# Patient Record
Sex: Male | Born: 1990 | Race: Black or African American | Hispanic: No | Marital: Single | Smoking: Never smoker
Health system: Southern US, Community
[De-identification: ages and names within clinical notes are randomized; demographics above are authoritative.]

## PROBLEM LIST (undated history)

## (undated) DIAGNOSIS — I10 Essential (primary) hypertension: Secondary | ICD-10-CM

## (undated) DIAGNOSIS — K5090 Crohn's disease, unspecified, without complications: Secondary | ICD-10-CM

---
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
## 2012-05-31 ENCOUNTER — Emergency Department (HOSPITAL_COMMUNITY)
Admission: EM | Admit: 2012-05-31 | Discharge: 2012-05-31 | Discharge status: Against Medical Advice | Payer: Medicaid Other | Attending: Emergency Medicine | Admitting: Emergency Medicine

## 2012-05-31 ENCOUNTER — Encounter (HOSPITAL_COMMUNITY): Payer: Self-pay | Admitting: Cardiology

## 2012-05-31 ENCOUNTER — Emergency Department (HOSPITAL_COMMUNITY): Payer: Medicaid Other

## 2012-05-31 DIAGNOSIS — R0789 Other chest pain: Secondary | ICD-10-CM | POA: Insufficient documentation

## 2012-05-31 DIAGNOSIS — I10 Essential (primary) hypertension: Secondary | ICD-10-CM | POA: Insufficient documentation

## 2012-05-31 DIAGNOSIS — K5090 Crohn's disease, unspecified, without complications: Secondary | ICD-10-CM | POA: Insufficient documentation

## 2012-05-31 DIAGNOSIS — R002 Palpitations: Secondary | ICD-10-CM | POA: Insufficient documentation

## 2012-05-31 DIAGNOSIS — I499 Cardiac arrhythmia, unspecified: Secondary | ICD-10-CM | POA: Insufficient documentation

## 2012-05-31 HISTORY — DX: Crohn's disease, unspecified, without complications: K50.90

## 2012-05-31 HISTORY — DX: Essential (primary) hypertension: I10

## 2012-05-31 LAB — TSH: TSH: 0.476 u[IU]/mL (ref 0.350–4.500)

## 2012-05-31 LAB — CK TOTAL AND CKMB (NOT AT ARMC)
CK, MB: 1.8 ng/mL (ref 0.3–4.0)
Relative Index: 1.4 (ref 0.0–2.5)
Total CK: 133 U/L (ref 7–232)

## 2012-05-31 LAB — RAPID URINE DRUG SCREEN, HOSP PERFORMED
Benzodiazepines: NOT DETECTED
Cocaine: NOT DETECTED

## 2012-05-31 LAB — POCT I-STAT, CHEM 8
Calcium, Ion: 1.25 mmol/L — ABNORMAL HIGH (ref 1.12–1.23)
Creatinine, Ser: 1.1 mg/dL (ref 0.50–1.35)
Glucose, Bld: 95 mg/dL (ref 70–99)
HCT: 40 % (ref 39.0–52.0)
Sodium: 143 mEq/L (ref 135–145)
TCO2: 29 mmol/L (ref 0–100)

## 2012-05-31 LAB — TROPONIN I
Troponin I: <0.3 ng/mL (ref <0.30)
Troponin I: <0.3 ng/mL (ref <0.30)

## 2012-05-31 NOTE — ED Provider Notes (Signed)
Medical screening examination/treatment/procedure(s) were performed by non-physician practitioner and as supervising physician I was immediately available for consultation/collaboration.   Richardean Canal, MD 05/31/12 [phone]

## 2012-05-31 NOTE — ED Provider Notes (Signed)
Patient has opted not to wait for completion of lab testing.  Currently resting comfortably with family at bedside.  No return of symptoms. Risks of leaving prior to completion of testing provided, and patient voices understanding.  Referral information provided for local cardiology ([place])--patient has also been followed by cardiology at [facility].  Jimmye Norman, NP 05/31/12 2313

## 2012-05-31 NOTE — ED Provider Notes (Signed)
History     CSN: [id number]  Arrival date & time 05/31/12  1539   First MD Initiated Contact with Patient 05/31/12 1542      Chief Complaint  Patient presents with  . Irregular Heart Beat    (Consider location/radiation/quality/duration/timing/severity/associated sxs/prior treatment) Patient is a 21 y.o. male presenting with palpitations. The history is provided by the patient.  Palpitations  This is a recurrent problem. The current episode started less than 1 hour ago. Episode frequency: Once. The problem has been resolved. On average, each episode lasts 10 minutes. Associated symptoms include chest pain (tightness). Pertinent negatives include no fever, no nausea, no vomiting, no cough and no shortness of breath. He has tried nothing for the symptoms. The treatment provided no relief. There are no known risk factors. His past medical history does not include anemia, heart disease, hyperthyroidism or valve disorder.    Past Medical History  Diagnosis Date  . Crohn disease   . Hypertension     History reviewed. No pertinent past surgical history.  History reviewed. No pertinent family history.  History  Substance Use Topics  . Smoking status: Not on file  . Smokeless tobacco: Not on file  . Alcohol Use:       Review of Systems  Constitutional: Negative for fever.  Respiratory: Negative for cough and shortness of breath.   Cardiovascular: Positive for chest pain (tightness) and palpitations.  Gastrointestinal: Negative for nausea and vomiting.  All other systems reviewed and are negative.    Allergies  Review of patient's allergies indicates no known allergies.  Home Medications   Current Outpatient Rx  Name  Route  Sig  Dispense  Refill  . ATENOLOL 50 MG PO TABS   Oral   Take 50 mg by mouth 2 (two) times daily.         Marland Kitchen REMICADE IV   Intravenous   Inject 1 Syringe into the vein every 8 (eight) weeks.          . TRETINOIN MICROSPHERE 0.04 % EX GEL   Topical   Apply 1 application topically at bedtime.           BP 153/90  Pulse 82  Temp 99 F (37.2 C) (Oral)  Resp 18  SpO2 100%  Physical Exam  Constitutional: He is oriented to person, place, and time. He appears well-developed and well-nourished. No distress.  HENT:  Head: Normocephalic and atraumatic.  Mouth/Throat: No oropharyngeal exudate.  Eyes: EOM are normal. Pupils are equal, round, and reactive to light.  Neck: Normal range of motion. Neck supple.  Cardiovascular: Normal rate and regular rhythm.  Exam reveals no friction rub.   No murmur heard. Pulmonary/Chest: Effort normal and breath sounds normal. No respiratory distress. He has no wheezes. He has no rales.  Abdominal: He exhibits no distension. There is no tenderness. There is no rebound.  Musculoskeletal: Normal range of motion. He exhibits no edema.  Neurological: He is alert and oriented to person, place, and time.  Skin: He is not diaphoretic.    ED Course  Procedures (including critical care time)  Labs Reviewed  POCT I-STAT, CHEM 8 - Abnormal; Notable for the following:    Calcium, Ion 1.25 (*)     All other components within normal limits  URINE RAPID DRUG SCREEN (HOSP PERFORMED)  TSH  TROPONIN I  CK TOTAL AND CKMB  TROPONIN I   Dg Chest 2 View  05/31/2012  *RADIOLOGY REPORT*  Clinical Data: Irregular heart beat.  Left chest pain.  CHEST - 2 VIEW  Comparison: 05/16/2009  Findings: Heart and mediastinal contours are within normal limits. No focal opacities or effusions.  No acute bony abnormality.  IMPRESSION: No active cardiopulmonary disease.   Original Report Authenticated By: Charlett Nose, M.D.      1. Irregular heart rate       MDM   And the patient is a 21 year old male with history of hypertension on atenolol who presents with palpitations. Had a 10 minute episode of palpitations with associated chest tightness after eating lunch today. History of palpitations in the past but not  had any previous diagnosis and he is aware of.  Patient here. EKG is normal. EKG is read as her carditis, however he has no pain, no diffuse ST elevation that I can see that is new. All the elevation looks like an early repolarization. With family history of early cardiac death (father at age 67), will cycle troponins. To CDU to await his troponins.   Elwin Mocha, MD 05/31/12 [phone]

## 2012-05-31 NOTE — ED Notes (Signed)
Pt to department via EMS from home- reports he was at home and started having palpations. Reports pressure and slight weakness. No distress on arrival. Bp-138/90 Hr-90.

## 2012-06-02 NOTE — ED Provider Notes (Signed)
Medical screening examination/treatment/procedure(s) were performed by non-physician practitioner and as supervising physician I was immediately available for consultation/collaboration.  Chad Reese is a 21 y.o. male hx of HTN here with palpitations. Palpitations for 10 min with chest tightness after lunch. EKG ? Pericarditis, but unchanged from prior and his CK was not elevated. His father died at age 8 so the decision is to do delta troponin. He was transferred to the CDU. In the CDU, he didn't want to wait for his second troponin so signed out AMA. The PA discussed the risk and benefits with him. He will f/u with [facility] cardiology.    Wandra Arthurs, MD 06/02/12 [phone]

## 2013-08-09 ENCOUNTER — Emergency Department (HOSPITAL_COMMUNITY)
Admission: EM | Admit: 2013-08-09 | Discharge: 2013-08-09 | Disposition: A | Discharge status: Home / Self Care | Payer: Medicaid Other | Attending: Emergency Medicine | Admitting: Emergency Medicine

## 2013-08-09 ENCOUNTER — Encounter (HOSPITAL_COMMUNITY): Payer: Self-pay | Admitting: Emergency Medicine

## 2013-08-09 DIAGNOSIS — I10 Essential (primary) hypertension: Secondary | ICD-10-CM | POA: Insufficient documentation

## 2013-08-09 DIAGNOSIS — J029 Acute pharyngitis, unspecified: Secondary | ICD-10-CM | POA: Insufficient documentation

## 2013-08-09 DIAGNOSIS — Z79899 Other long term (current) drug therapy: Secondary | ICD-10-CM | POA: Insufficient documentation

## 2013-08-09 DIAGNOSIS — K5090 Crohn's disease, unspecified, without complications: Secondary | ICD-10-CM | POA: Insufficient documentation

## 2013-08-09 MED ORDER — SODIUM CHLORIDE 0.9 % IV SOLN: 1000.0000 mL | INTRAVENOUS | Status: DC

## 2013-08-09 MED ORDER — CLINDAMYCIN PHOSPHATE 600 MG/50ML IV SOLN
600.0000 mg | Freq: Once | INTRAVENOUS | Status: AC
  Administered 2013-08-09: 600 mg via INTRAVENOUS
  Filled 2013-08-09: qty 50

## 2013-08-09 MED ORDER — CLINDAMYCIN HCL 150 MG PO CAPS: 300.0000 mg | ORAL_CAPSULE | Freq: Three times a day (TID) | ORAL | Status: DC

## 2013-08-09 MED ORDER — SODIUM CHLORIDE 0.9 % IV SOLN
1000.0000 mL | Freq: Once | INTRAVENOUS | Status: AC
  Administered 2013-08-09: 1000 mL via INTRAVENOUS

## 2013-08-09 NOTE — ED Provider Notes (Signed)
CSN: [id number]     Arrival date & time 08/09/13  1046 History   First MD Initiated Contact with Patient 08/09/13 1104     Chief Complaint  Patient presents with  . Sore Throat     (Consider location/radiation/quality/duration/timing/severity/associated sxs/prior Treatment) HPI Comments: Patient presents to the emergency department with chief complaint of sore throat. He states that the symptoms started approximately 10 days ago. He has had 2 negative strep test by his PCP. He states that he has been afebrile. He has been treated with amoxicillin and steroids with no improvement. States that the symptoms are persisting. He denies difficulty swallowing or breathing. Nothing makes his symptoms better or worse.  The history is provided by the patient. No language interpreter was used.    Past Medical History  Diagnosis Date  . Crohn disease   . Hypertension    History reviewed. No pertinent past surgical history. History reviewed. No pertinent family history. History  Substance Use Topics  . Smoking status: Never Smoker   . Smokeless tobacco: Never Used  . Alcohol Use: No    Review of Systems  Constitutional: Negative for fever.  HENT: Positive for sore throat. Negative for rhinorrhea.   Respiratory: Negative for cough, choking, shortness of breath and stridor.       Allergies  Review of patient's allergies indicates no known allergies.  Home Medications   Current Outpatient Rx  Name  Route  Sig  Dispense  Refill  . acetaminophen (TYLENOL) 500 MG tablet   Oral   Take 1,000 mg by mouth every 6 (six) hours as needed for mild pain or headache.         Marland Kitchen atenolol (TENORMIN) 50 MG tablet   Oral   Take 50 mg by mouth 2 (two) times daily.         . InFLIXimab (REMICADE IV)   Intravenous   Inject 1 Syringe into the vein every 8 (eight) weeks.          . naproxen sodium (ANAPROX) 220 MG tablet   Oral   Take 220 mg by mouth 2 (two) times daily as needed (pain).           BP 141/95  Pulse 105  Temp(Src) 97.9 F (36.6 C) (Oral)  Resp 18  SpO2 99% Physical Exam  Nursing note and vitals reviewed. Constitutional: He is oriented to person, place, and time. He appears well-developed and well-nourished.  HENT:  Head: Normocephalic and atraumatic.  Oropharynx is erythematous, with the left side being greater than the right, there is some deviation, some evidence of left-sided peritonsillar abscess  Eyes: Conjunctivae and EOM are normal.  Neck: Normal range of motion.  Cardiovascular: Normal rate.   Pulmonary/Chest: Effort normal.  Abdominal: He exhibits no distension.  Musculoskeletal: Normal range of motion.  Neurological: He is alert and oriented to person, place, and time.  Skin: Skin is dry.  Psychiatric: He has a normal mood and affect. His behavior is normal. Judgment and thought content normal.    ED Course  Procedures (including critical care time) Labs Review Labs Reviewed - No data to display Imaging Review No results found.  EKG Interpretation   None       MDM   Final diagnoses:  Sore throat    Patient with sore throat.  He has been treated with amoxicillin and steroids with no relief.    Patient discussed with Dr. Loretha Stapler, who recommends consulting ENT.  I discussed the patient with Dr.  Shoemaker from ENT, who recommends giving the patient a dose of IV clindamycin and 2 liters of fluid.  Recommends discharging on Clindamycin, and seeing the patient for follow-up in 2 days.  Strict and clear return precautions have been given.  Patient will return to the ED or call 911 if symptoms worsen, otherwise will follow-up with Dr. Annalee GentaShoemaker in his office.  Discussed with Dr. Loretha StaplerWofford, who agrees with the plan.  Patient is stable and ready for discharge.  3:53 PM Re-examined several times.  No stridor, drooling, or airway compromise.  Roxy Horsemanobert Sister Carbone, PA-C 08/09/13 1553  Roxy Horsemanobert Elina Streng, PA-C 08/09/13 [phone]

## 2013-08-09 NOTE — Progress Notes (Signed)
   CARE MANAGEMENT ED NOTE 08/09/2013  Patient:  Chad Reese, Chad Reese   Account Number:  [id number]  Date Initiated:  08/09/2013  Documentation initiated by:  Radford Pax  Subjective/Objective Assessment:   Patient presents to Ed with sore throat for ten days and fever.     Subjective/Objective Assessment Detail:     Action/Plan:   Action/Plan Detail:   Anticipated DC Date:       Status Recommendation to Physician:   Result of Recommendation:    Other ED Services  Consult Working Plan    DC Planning Services  Other  PCP issues    Choice offered to / List presented to:            Status of service:  Completed, signed off  ED Comments:   ED Comments Detail:  Patient confirms his pcp is located at [facility] internal Medicine at [facility].  System updated.

## 2013-08-09 NOTE — Discharge Instructions (Signed)
Peritonsillar Abscess A peritonsillar abscess is a collection of pus located in the back of the throat behind the tonsils. It usually occurs when a streptococcal infection of the throat or tonsils spreads into the space around the tonsils. They are almost always caused by the streptococcal germ (bacteria). The treatment of a peritonsillar abscess is most often drainage accomplished by putting a needle into the abscess or cutting (incising) and draining the abscess. This is most often followed with a course of antibiotics. HOME CARE INSTRUCTIONS  If your abscess was drained by your caregiver today, rinse your throat (gargle) with warm salt water four times per day or as needed for comfort. Do not swallow this mixture. Mix 1 teaspoon of salt in 8 ounces of warm water for gargling.  Rest in bed as needed. Resume activities as able.  Apply cold to your neck for pain relief. Fill a plastic bag with ice and wrap it in a towel. Hold the ice on your neck for 20 minutes 4 times per day.  Eat a soft or liquid diet as tolerated while your throat remains sore. Popsicles and ice cream may be good early choices. Drinking plenty of cold fluids will probably be soothing and help take swelling down in between the warm gargles.  Only take over-the-counter or prescription medicines for pain, discomfort, or fever as directed by your caregiver. Do not use aspirin unless directed by your physician. Aspirin slows down the clotting process. It can also cause bleeding from the drainage area if this was needled or incised today.  If antibiotics were prescribed, take them as directed for the full course of the prescription. Even if you feel you are well, you need to take them. SEEK MEDICAL CARE IF:   You have increased pain, swelling, redness, or drainage in your throat.  You develop signs of infection such as dizziness, headache, lethargy, or generalized feelings of illness.  You have difficulty breathing, swallowing or  eating.  You show signs of becoming dehydrated (lightheadedness when standing, decreased urine output, a fast heart rate, or dry mouth and mucous membranes). SEEK IMMEDIATE MEDICAL CARE IF:   You have a fever.  You are coughing up or vomiting blood.  You develop more severe throat pain uncontrolled with medicines or you start to drool.  You develop difficulty breathing, talking, or find it easier to breathe while leaning forward. Document Released: 06/01/2005 Document Revised: 08/24/2011 Document Reviewed: 01/13/2008 [facility] Partnership[facility] Patient Information 2014 [place], [place]. Sore Throat A sore throat is pain, burning, irritation, or scratchiness of the throat. There is often pain or tenderness when swallowing or talking. A sore throat may be accompanied by other symptoms, such as coughing, sneezing, fever, and swollen neck glands. A sore throat is often the first sign of another sickness, such as a cold, flu, strep throat, or mononucleosis (commonly known as mono). Most sore throats go away without medical treatment. CAUSES  The most common causes of a sore throat include:  A viral infection, such as a cold, flu, or mono.  A bacterial infection, such as strep throat, tonsillitis, or whooping cough.  Seasonal allergies.  Dryness in the air.  Irritants, such as smoke or pollution.  Gastroesophageal reflux disease (GERD). HOME CARE INSTRUCTIONS   Only take over-the-counter medicines as directed by your caregiver.  Drink enough fluids to keep your urine clear or pale yellow.  Rest as needed.  Try using throat sprays, lozenges, or sucking on hard candy to ease any pain (if older than 4  years or as directed).  Sip warm liquids, such as broth, herbal tea, or warm water with honey to relieve pain temporarily. You may also eat or drink cold or frozen liquids such as frozen ice pops.  Gargle with salt water (mix 1 tsp salt with 8 oz of water).  Do not smoke and avoid secondhand  smoke.  Put a cool-mist humidifier in your bedroom at night to moisten the air. You can also turn on a hot shower and sit in the bathroom with the door closed for 5 10 minutes. SEEK IMMEDIATE MEDICAL CARE IF:  You have difficulty breathing.  You are unable to swallow fluids, soft foods, or your saliva.  You have increased swelling in the throat.  Your sore throat does not get better in 7 days.  You have nausea and vomiting.  You have a fever or persistent symptoms for more than 2 3 days.  You have a fever and your symptoms suddenly get worse. MAKE SURE YOU:   Understand these instructions.  Will watch your condition.  Will get help right away if you are not doing well or get worse. Document Released: 07/09/2004 Document Revised: 05/18/2012 Document Reviewed: 02/07/2012 [facility] Patient Information 2014 [place], [place].

## 2013-08-09 NOTE — Progress Notes (Signed)
[facility] provided pt with a list of primary care resources. Patient stated that she was pending insurance on 3/1.

## 2013-08-09 NOTE — ED Provider Notes (Signed)
Medical screening examination/treatment/procedure(s) were conducted as a shared visit with non-physician practitioner(s) and myself.  I personally evaluated the patient during the encounter.  EKG Interpretation   None       23 yo male with sore throat for 10 days.  Fevers initially, not now.  On exam, well appearing, no stridor, no distress, normal respiratory effort, mild to moderate swelling of left posterior pharynx without significant mass effect.  Plan to discuss with ENT.    Clinical Impression: 1. Sore throat       Candyce Churn III, MD 08/09/13 [phone]

## 2013-08-09 NOTE — ED Notes (Signed)
Per pt, sore throat starting Feb 14th  Pt states he has had 2 previous test for strep since event started.  Fever in beginning but has had no fever since Tuesday.  Pt also states he has been treated with steroids by Personal physician.

## 2013-08-09 NOTE — ED Provider Notes (Signed)
Medical screening examination/treatment/procedure(s) were conducted as a shared visit with non-physician practitioner(s) and myself.  I personally evaluated the patient during the encounter.  EKG Interpretation   None         Candyce Churn III, MD 08/09/13 [phone]

## 2014-05-21 ENCOUNTER — Other Ambulatory Visit: Payer: Self-pay

## 2014-05-21 ENCOUNTER — Encounter (HOSPITAL_COMMUNITY): Payer: Self-pay | Admitting: Family Medicine

## 2014-05-21 ENCOUNTER — Emergency Department (HOSPITAL_COMMUNITY): Payer: Medicaid Other

## 2014-05-21 ENCOUNTER — Emergency Department (HOSPITAL_COMMUNITY)
Admission: EM | Admit: 2014-05-21 | Discharge: 2014-05-21 | Disposition: A | Discharge status: Home / Self Care | Payer: Medicaid Other | Attending: Emergency Medicine | Admitting: Emergency Medicine

## 2014-05-21 DIAGNOSIS — K5090 Crohn's disease, unspecified, without complications: Secondary | ICD-10-CM | POA: Insufficient documentation

## 2014-05-21 DIAGNOSIS — Z79899 Other long term (current) drug therapy: Secondary | ICD-10-CM | POA: Insufficient documentation

## 2014-05-21 DIAGNOSIS — I10 Essential (primary) hypertension: Secondary | ICD-10-CM | POA: Insufficient documentation

## 2014-05-21 DIAGNOSIS — R079 Chest pain, unspecified: Secondary | ICD-10-CM

## 2014-05-21 DIAGNOSIS — Z792 Long term (current) use of antibiotics: Secondary | ICD-10-CM | POA: Insufficient documentation

## 2014-05-21 DIAGNOSIS — R0789 Other chest pain: Secondary | ICD-10-CM | POA: Insufficient documentation

## 2014-05-21 DIAGNOSIS — Z791 Long term (current) use of non-steroidal anti-inflammatories (NSAID): Secondary | ICD-10-CM | POA: Insufficient documentation

## 2014-05-21 LAB — CBC WITH DIFFERENTIAL/PLATELET
BASOS ABS: 0 10*3/uL (ref 0.0–0.1)
BASOS PCT: 1 % (ref 0–1)
Eosinophils Absolute: 0.1 10*3/uL (ref 0.0–0.7)
Eosinophils Relative: 2 % (ref 0–5)
HEMATOCRIT: 40.1 % (ref 39.0–52.0)
Hemoglobin: 13.7 g/dL (ref 13.0–17.0)
Lymphocytes Relative: 32 % (ref 12–46)
Lymphs Abs: 1.4 10*3/uL (ref 0.7–4.0)
MCH: 29.3 pg (ref 26.0–34.0)
MCHC: 34.2 g/dL (ref 30.0–36.0)
MCV: 85.7 fL (ref 78.0–100.0)
Monocytes Absolute: 0.4 10*3/uL (ref 0.1–1.0)
Monocytes Relative: 10 % (ref 3–12)
NEUTROS ABS: 2.3 10*3/uL (ref 1.7–7.7)
Neutrophils Relative %: 55 % (ref 43–77)
Platelets: 223 10*3/uL (ref 150–400)
RBC: 4.68 MIL/uL (ref 4.22–5.81)
RDW: 12 % (ref 11.5–15.5)
WBC: 4.2 10*3/uL (ref 4.0–10.5)

## 2014-05-21 LAB — I-STAT CHEM 8, ED
BUN: 9 mg/dL (ref 6–23)
CALCIUM ION: 1.19 mmol/L (ref 1.12–1.23)
CHLORIDE: 105 meq/L (ref 96–112)
Creatinine, Ser: 0.9 mg/dL (ref 0.50–1.35)
Glucose, Bld: 85 mg/dL (ref 70–99)
HEMATOCRIT: 42 % (ref 39.0–52.0)
HEMOGLOBIN: 14.3 g/dL (ref 13.0–17.0)
Potassium: 4 mEq/L (ref 3.7–5.3)
Sodium: 140 mEq/L (ref 137–147)
TCO2: 25 mmol/L (ref 0–100)

## 2014-05-21 LAB — I-STAT TROPONIN, ED
TROPONIN I, POC: 0 ng/mL (ref 0.00–0.08)
Troponin i, poc: 0.06 ng/mL (ref 0.00–0.08)

## 2014-05-21 NOTE — ED Provider Notes (Signed)
CSN: [id number]     Arrival date & time 05/21/14  1037 History   First MD Initiated Contact with Patient 05/21/14 1044     Chief Complaint  Patient presents with  . Chest Pain     (Consider location/radiation/quality/duration/timing/severity/associated sxs/prior Treatment) Patient is a 23 y.o. male presenting with chest pain. The history is provided by the patient.  Chest Pain Pain location:  L chest Associated symptoms: nausea   Associated symptoms: no abdominal pain, no back pain, no headache, no numbness, no shortness of breath, not vomiting and no weakness    patient presents with sharp left-sided chest pain. Began while eating. Last around 40 minutes and improved with nitroglycerin. Pain-free now. Mild nausea before the event. No vomiting. No shortness of breath. No pleuritic aspect. Patient has Crohn's disease and hypertension. He has a cardiologist at [facility]. He states his father had a heart attack at 45 years old, and his father's brother also had an MI at a young age. Patient denies substance abuse. No fevers. No trauma. No blood in the stool.  Past Medical History  Diagnosis Date  . Crohn disease   . Hypertension    History reviewed. No pertinent past surgical history. No family history on file. History  Substance Use Topics  . Smoking status: Never Smoker   . Smokeless tobacco: Never Used  . Alcohol Use: No    Review of Systems  Constitutional: Negative for activity change and appetite change.  Eyes: Negative for pain.  Respiratory: Negative for chest tightness and shortness of breath.   Cardiovascular: Positive for chest pain. Negative for leg swelling.  Gastrointestinal: Positive for nausea. Negative for vomiting, abdominal pain and diarrhea.  Genitourinary: Negative for flank pain.  Musculoskeletal: Negative for back pain and neck stiffness.  Skin: Negative for rash.  Neurological: Negative for weakness, numbness and headaches.  Psychiatric/Behavioral:  Negative for behavioral problems.      Allergies  Review of patient's allergies indicates no known allergies.  Home Medications   Prior to Admission medications   Medication Sig Start Date End Date Taking? Authorizing Provider  acetaminophen (TYLENOL) 500 MG tablet Take 1,000 mg by mouth every 6 (six) hours as needed for mild pain or headache.   Yes Historical Provider, MD  atenolol (TENORMIN) 50 MG tablet Take 50 mg by mouth 2 (two) times daily.   Yes Historical Provider, MD  InFLIXimab (REMICADE IV) Inject into the vein every 8 (eight) weeks.    Yes Historical Provider, MD  clindamycin (CLEOCIN) 150 MG capsule Take 2 capsules (300 mg total) by mouth 3 (three) times daily. May dispense as 17m capsules 08/09/13   RMontine Circle PA-C  naproxen sodium (ANAPROX) 220 MG tablet Take 220 mg by mouth 2 (two) times daily as needed (pain).    Historical Provider, MD   BP 122/77 mmHg  Pulse 67  Temp(Src) 98.3 F (36.8 C) (Oral)  Resp 15  Ht 5' 6"  (1.676 m)  Wt 158 lb (71.668 kg)  BMI 25.51 kg/m2  SpO2 99% Physical Exam  Constitutional: He appears well-developed and well-nourished.  HENT:  Head: Normocephalic and atraumatic.  Neck: Neck supple. No JVD present.  Cardiovascular: Normal rate, regular rhythm and normal heart sounds.  Exam reveals no gallop and no friction rub.   No murmur heard. Pulmonary/Chest: Effort normal and breath sounds normal.  Abdominal: Soft. There is no tenderness.  Musculoskeletal: Normal range of motion. He exhibits no edema.  Neurological: He is alert. No cranial nerve deficit.  Skin: Skin is warm and dry. No rash noted.  Psychiatric: He has a normal mood and affect.  Nursing note and vitals reviewed.   ED Course  Procedures (including critical care time) Labs Review Labs Reviewed  CBC WITH DIFFERENTIAL  I-STAT CHEM 8, ED  I-STAT TROPOININ, ED  Randolm Idol, ED    Imaging Review Dg Chest 2 View  05/21/2014   CLINICAL DATA:  Sudden onset  left chest pain radiating to the left arm today.  EXAM: CHEST  2 VIEW  COMPARISON:  May 31, 2012  FINDINGS: The heart size and mediastinal contours are within normal limits. There is no focal infiltrate, pulmonary edema, or pleural effusion. The visualized skeletal structures are unremarkable.  IMPRESSION: No active cardiopulmonary disease.   Electronically Signed   By: Abelardo Diesel M.D.   On: 05/21/2014 13:54     EKG Interpretation None      Date: 05/21/2014  Rate: 77  Rhythm: normal sinus rhythm  QRS Axis: normal  Intervals: normal  ST/T Wave abnormalities: early repolarization  Conduction Disutrbances:none  Narrative Interpretation:   Old EKG Reviewed: none available   MDM   Final diagnoses:  Chest pain    Patient with chest pain. EKG reassuring. Troponin negative 2. He is low risk except for family history. Will discharge to follow with his cardiologist. Doubt pulmonary embolism or aortic dissection.    Jasper Riling. Alvino Chapel, MD 05/22/14 1251

## 2014-05-21 NOTE — Discharge Instructions (Signed)

## 2014-05-21 NOTE — ED Notes (Addendum)
Pt presents from work via [employer] with c/o sudden onset left sided chest pain that radiated to left arm. PT was given 1SL nitro and 324mg  ASA PTA with relief of all pain. Pt is A&Ox4 and in NAD. PT reports father had MI at age 21.

## 2014-05-21 NOTE — ED Notes (Signed)
To x-ray

## 2015-02-15 ENCOUNTER — Encounter (HOSPITAL_COMMUNITY): Payer: Self-pay | Admitting: Emergency Medicine

## 2015-02-15 ENCOUNTER — Emergency Department (HOSPITAL_COMMUNITY)
Admission: EM | Admit: 2015-02-15 | Discharge: 2015-02-15 | Disposition: A | Discharge status: Home / Self Care | Payer: BLUE CROSS/BLUE SHIELD | Attending: Emergency Medicine | Admitting: Emergency Medicine

## 2015-02-15 ENCOUNTER — Emergency Department (HOSPITAL_COMMUNITY): Payer: BLUE CROSS/BLUE SHIELD

## 2015-02-15 DIAGNOSIS — T1490XA Injury, unspecified, initial encounter: Secondary | ICD-10-CM

## 2015-02-15 DIAGNOSIS — S139XXA Sprain of joints and ligaments of unspecified parts of neck, initial encounter: Secondary | ICD-10-CM | POA: Diagnosis not present

## 2015-02-15 DIAGNOSIS — Z8719 Personal history of other diseases of the digestive system: Secondary | ICD-10-CM | POA: Insufficient documentation

## 2015-02-15 DIAGNOSIS — S199XXA Unspecified injury of neck, initial encounter: Secondary | ICD-10-CM | POA: Diagnosis present

## 2015-02-15 DIAGNOSIS — Z79899 Other long term (current) drug therapy: Secondary | ICD-10-CM | POA: Insufficient documentation

## 2015-02-15 DIAGNOSIS — Y998 Other external cause status: Secondary | ICD-10-CM | POA: Diagnosis not present

## 2015-02-15 DIAGNOSIS — Y92410 Unspecified street and highway as the place of occurrence of the external cause: Secondary | ICD-10-CM | POA: Diagnosis not present

## 2015-02-15 DIAGNOSIS — S299XXA Unspecified injury of thorax, initial encounter: Secondary | ICD-10-CM | POA: Insufficient documentation

## 2015-02-15 DIAGNOSIS — Y9389 Activity, other specified: Secondary | ICD-10-CM | POA: Diagnosis not present

## 2015-02-15 DIAGNOSIS — I10 Essential (primary) hypertension: Secondary | ICD-10-CM | POA: Diagnosis not present

## 2015-02-15 MED ORDER — ACETAMINOPHEN 325 MG PO TABS
650.0000 mg | ORAL_TABLET | Freq: Once | ORAL | Status: AC
  Administered 2015-02-15: 650 mg via ORAL
  Filled 2015-02-15: qty 2

## 2015-02-15 MED ORDER — METHOCARBAMOL 750 MG PO TABS: 750.0000 mg | ORAL_TABLET | Freq: Four times a day (QID) | ORAL | Status: DC
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
## 2015-02-15 NOTE — ED Notes (Signed)
Patient transported to CT 

## 2015-02-15 NOTE — Discharge Instructions (Signed)
Chest Wall Pain Chest wall pain is pain in or around the bones and muscles of your chest. It may take up to 6 weeks to get better. It may take longer if you must stay physically active in your work and activities.  CAUSES  Chest wall pain may happen on its own. However, it may be caused by:  A viral illness like the flu.  Injury.  Coughing.  Exercise.  Arthritis.  Fibromyalgia.  Shingles. HOME CARE INSTRUCTIONS   Avoid overtiring physical activity. Try not to strain or perform activities that cause pain. This includes any activities using your chest or your abdominal and side muscles, especially if heavy weights are used.  Put ice on the sore area.  Put ice in a plastic bag.  Place a towel between your skin and the bag.  Leave the ice on for 15-20 minutes per hour while awake for the first 2 days.  Only take over-the-counter or prescription medicines for pain, discomfort, or fever as directed by your caregiver. SEEK IMMEDIATE MEDICAL CARE IF:   Your pain increases, or you are very uncomfortable.  You have a fever.  Your chest pain becomes worse.  You have new, unexplained symptoms.  You have nausea or vomiting.  You feel sweaty or lightheaded.  You have a cough with phlegm (sputum), or you cough up blood. MAKE SURE YOU:   Understand these instructions.  Will watch your condition.  Will get help right away if you are not doing well or get worse. Document Released: 06/01/2005 Document Revised: 08/24/2011 Document Reviewed: 01/26/2011 [facility] Patient Information 2015 [place], [place]. This information is not intended to replace advice given to you by your health care provider. Make sure you discuss any questions you have with your health care provider. Cervical Sprain A cervical sprain is an injury in the neck in which the strong, fibrous tissues (ligaments) that connect your neck bones stretch or tear. Cervical sprains can range from mild to severe. Severe  cervical sprains can cause the neck vertebrae to be unstable. This can lead to damage of the spinal cord and can result in serious nervous system problems. The amount of time it takes for a cervical sprain to get better depends on the cause and extent of the injury. Most cervical sprains heal in 1 to 3 weeks. CAUSES  Severe cervical sprains may be caused by:   Contact sport injuries (such as from football, rugby, wrestling, hockey, auto racing, gymnastics, diving, martial arts, or boxing).   Motor vehicle collisions.   Whiplash injuries. This is an injury from a sudden forward and backward whipping movement of the head and neck.  Falls.  Mild cervical sprains may be caused by:   Being in an awkward position, such as while cradling a telephone between your ear and shoulder.   Sitting in a chair that does not offer proper support.   Working at a poorly Landscape architect station.   Looking up or down for long periods of time.  SYMPTOMS   Pain, soreness, stiffness, or a burning sensation in the front, back, or sides of the neck. This discomfort may develop immediately after the injury or slowly, 24 hours or more after the injury.   Pain or tenderness directly in the middle of the back of the neck.   Shoulder or upper back pain.   Limited ability to move the neck.   Headache.   Dizziness.   Weakness, numbness, or tingling in the hands or arms.   Muscle  spasms.   Difficulty swallowing or chewing.   Tenderness and swelling of the neck.  DIAGNOSIS  Most of the time your health care provider can diagnose a cervical sprain by taking your history and doing a physical exam. Your health care provider will ask about previous neck injuries and any known neck problems, such as arthritis in the neck. X-rays may be taken to find out if there are any other problems, such as with the bones of the neck. Other tests, such as a CT scan or MRI, may also be needed.  TREATMENT    Treatment depends on the severity of the cervical sprain. Mild sprains can be treated with rest, keeping the neck in place (immobilization), and pain medicines. Severe cervical sprains are immediately immobilized. Further treatment is done to help with pain, muscle spasms, and other symptoms and may include:  Medicines, such as pain relievers, numbing medicines, or muscle relaxants.   Physical therapy. This may involve stretching exercises, strengthening exercises, and posture training. Exercises and improved posture can help stabilize the neck, strengthen muscles, and help stop symptoms from returning.  HOME CARE INSTRUCTIONS   Put ice on the injured area.   Put ice in a plastic bag.   Place a towel between your skin and the bag.   Leave the ice on for 15-20 minutes, 3-4 times a day.   If your injury was severe, you may have been given a cervical collar to wear. A cervical collar is a two-piece collar designed to keep your neck from moving while it heals.  Do not remove the collar unless instructed by your health care provider.  If you have long hair, keep it outside of the collar.  Ask your health care provider before making any adjustments to your collar. Minor adjustments may be required over time to improve comfort and reduce pressure on your chin or on the back of your head.  Ifyou are allowed to remove the collar for cleaning or bathing, follow your health care provider's instructions on how to do so safely.  Keep your collar clean by wiping it with mild soap and water and drying it completely. If the collar you have been given includes removable pads, remove them every 1-2 days and hand wash them with soap and water. Allow them to air dry. They should be completely dry before you wear them in the collar.  If you are allowed to remove the collar for cleaning and bathing, wash and dry the skin of your neck. Check your skin for irritation or sores. If you see any, tell your  health care provider.  Do not drive while wearing the collar.   Only take over-the-counter or prescription medicines for pain, discomfort, or fever as directed by your health care provider.   Keep all follow-up appointments as directed by your health care provider.   Keep all physical therapy appointments as directed by your health care provider.   Make any needed adjustments to your workstation to promote good posture.   Avoid positions and activities that make your symptoms worse.   Warm up and stretch before being active to help prevent problems.  SEEK MEDICAL CARE IF:   Your pain is not controlled with medicine.   You are unable to decrease your pain medicine over time as planned.   Your activity level is not improving as expected.  SEEK IMMEDIATE MEDICAL CARE IF:   You develop any bleeding.  You develop stomach upset.  You have signs of an  allergic reaction to your medicine.   Your symptoms get worse.   You develop new, unexplained symptoms.   You have numbness, tingling, weakness, or paralysis in any part of your body.  MAKE SURE YOU:   Understand these instructions.  Will watch your condition.  Will get help right away if you are not doing well or get worse. Document Released: 03/29/2007 Document Revised: 06/06/2013 Document Reviewed: 12/07/2012 [facility] Patient Information 2015 [place], [place]. This information is not intended to replace advice given to you by your health care provider. Make sure you discuss any questions you have with your health care provider. Motor Vehicle Collision It is common to have multiple bruises and sore muscles after a motor vehicle collision (MVC). These tend to feel worse for the first 24 hours. You may have the most stiffness and soreness over the first several hours. You may also feel worse when you wake up the first morning after your collision. After this point, you will usually begin to improve with each day. The  speed of improvement often depends on the severity of the collision, the number of injuries, and the location and nature of these injuries. HOME CARE INSTRUCTIONS  Put ice on the injured area.  Put ice in a plastic bag.  Place a towel between your skin and the bag.  Leave the ice on for 15-20 minutes, 3-4 times a day, or as directed by your health care provider.  Drink enough fluids to keep your urine clear or pale yellow. Do not drink alcohol.  Take a warm shower or bath once or twice a day. This will increase blood flow to sore muscles.  You may return to activities as directed by your caregiver. Be careful when lifting, as this may aggravate neck or back pain.  Only take over-the-counter or prescription medicines for pain, discomfort, or fever as directed by your caregiver. Do not use aspirin. This may increase bruising and bleeding. SEEK IMMEDIATE MEDICAL CARE IF:  You have numbness, tingling, or weakness in the arms or legs.  You develop severe headaches not relieved with medicine.  You have severe neck pain, especially tenderness in the middle of the back of your neck.  You have changes in bowel or bladder control.  There is increasing pain in any area of the body.  You have shortness of breath, light-headedness, dizziness, or fainting.  You have chest pain.  You feel sick to your stomach (nauseous), throw up (vomit), or sweat.  You have increasing abdominal discomfort.  There is blood in your urine, stool, or vomit.  You have pain in your shoulder (shoulder strap areas).  You feel your symptoms are getting worse. MAKE SURE YOU:  Understand these instructions.  Will watch your condition.  Will get help right away if you are not doing well or get worse. Document Released: 06/01/2005 Document Revised: 10/16/2013 Document Reviewed: 10/29/2010 [facility] Patient Information 2015 [place], [place]. This information is not intended to replace advice given to you by your  health care provider. Make sure you discuss any questions you have with your health care provider.
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
## 2015-02-15 NOTE — ED Notes (Signed)
D/C instructions and medications reviewed with pt.  Pt verbalized understanding.  Pt home with significant other.
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
## 2015-02-17 ENCOUNTER — Emergency Department (HOSPITAL_COMMUNITY)
Admission: EM | Admit: 2015-02-17 | Discharge: 2015-02-17 | Disposition: A | Discharge status: Home / Self Care | Payer: PRIVATE HEALTH INSURANCE | Attending: Emergency Medicine | Admitting: Emergency Medicine

## 2015-02-17 ENCOUNTER — Encounter (HOSPITAL_COMMUNITY): Payer: Self-pay | Admitting: Emergency Medicine

## 2015-02-17 DIAGNOSIS — M545 Low back pain: Secondary | ICD-10-CM | POA: Diagnosis present

## 2015-02-17 DIAGNOSIS — Z87828 Personal history of other (healed) physical injury and trauma: Secondary | ICD-10-CM | POA: Diagnosis not present

## 2015-02-17 DIAGNOSIS — M25511 Pain in right shoulder: Secondary | ICD-10-CM | POA: Insufficient documentation

## 2015-02-17 DIAGNOSIS — M62830 Muscle spasm of back: Secondary | ICD-10-CM | POA: Insufficient documentation

## 2015-02-17 DIAGNOSIS — Z79899 Other long term (current) drug therapy: Secondary | ICD-10-CM | POA: Insufficient documentation

## 2015-02-17 DIAGNOSIS — Z8719 Personal history of other diseases of the digestive system: Secondary | ICD-10-CM | POA: Insufficient documentation

## 2015-02-17 DIAGNOSIS — I10 Essential (primary) hypertension: Secondary | ICD-10-CM | POA: Insufficient documentation

## 2015-02-17 DIAGNOSIS — M62838 Other muscle spasm: Secondary | ICD-10-CM

## 2015-02-17 MED ORDER — KETOROLAC TROMETHAMINE 10 MG PO TABS: 10.0000 mg | ORAL_TABLET | Freq: Four times a day (QID) | ORAL | Status: DC | PRN
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
## 2015-02-17 NOTE — Discharge Instructions (Signed)
Please continue taking Robaxin and start taking Toradol as prescribed for pain. Please return to the Emergency Department if symptoms worsen or new onset of fever, numbness, tingling, loss of bowel or bladder, weakness.
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
## 2015-03-15 ENCOUNTER — Emergency Department (HOSPITAL_COMMUNITY)
Admission: EM | Admit: 2015-03-15 | Discharge: 2015-03-15 | Disposition: A | Discharge status: Home / Self Care | Payer: BLUE CROSS/BLUE SHIELD | Attending: Emergency Medicine | Admitting: Emergency Medicine

## 2015-03-15 ENCOUNTER — Emergency Department (HOSPITAL_COMMUNITY): Payer: BLUE CROSS/BLUE SHIELD

## 2015-03-15 ENCOUNTER — Encounter (HOSPITAL_COMMUNITY): Payer: Self-pay | Admitting: Emergency Medicine

## 2015-03-15 DIAGNOSIS — K5090 Crohn's disease, unspecified, without complications: Secondary | ICD-10-CM | POA: Insufficient documentation

## 2015-03-15 DIAGNOSIS — Z79899 Other long term (current) drug therapy: Secondary | ICD-10-CM | POA: Insufficient documentation

## 2015-03-15 DIAGNOSIS — I10 Essential (primary) hypertension: Secondary | ICD-10-CM | POA: Insufficient documentation

## 2015-03-15 DIAGNOSIS — R0602 Shortness of breath: Secondary | ICD-10-CM | POA: Insufficient documentation

## 2015-03-15 DIAGNOSIS — R110 Nausea: Secondary | ICD-10-CM | POA: Diagnosis not present

## 2015-03-15 DIAGNOSIS — R109 Unspecified abdominal pain: Secondary | ICD-10-CM | POA: Insufficient documentation

## 2015-03-15 DIAGNOSIS — R079 Chest pain, unspecified: Secondary | ICD-10-CM

## 2015-03-15 LAB — COMPREHENSIVE METABOLIC PANEL
ALT: 14 U/L — AB (ref 17–63)
AST: 19 U/L (ref 15–41)
Albumin: 3.3 g/dL — ABNORMAL LOW (ref 3.5–5.0)
Alkaline Phosphatase: 50 U/L (ref 38–126)
Anion gap: 6 (ref 5–15)
BUN: 10 mg/dL (ref 6–20)
CALCIUM: 9.3 mg/dL (ref 8.9–10.3)
CO2: 30 mmol/L (ref 22–32)
CREATININE: 1.06 mg/dL (ref 0.61–1.24)
Chloride: 103 mmol/L (ref 101–111)
Glucose, Bld: 86 mg/dL (ref 65–99)
Potassium: 4.2 mmol/L (ref 3.5–5.1)
Sodium: 139 mmol/L (ref 135–145)
TOTAL PROTEIN: 7.5 g/dL (ref 6.5–8.1)
Total Bilirubin: 1.1 mg/dL (ref 0.3–1.2)

## 2015-03-15 LAB — I-STAT TROPONIN, ED
TROPONIN I, POC: 0 ng/mL (ref 0.00–0.08)
Troponin i, poc: 0 ng/mL (ref 0.00–0.08)

## 2015-03-15 LAB — CBC WITH DIFFERENTIAL/PLATELET
BASOS ABS: 0 10*3/uL (ref 0.0–0.1)
BASOS PCT: 0 %
EOS ABS: 0.2 10*3/uL (ref 0.0–0.7)
Eosinophils Relative: 3 %
HCT: 36.9 % — ABNORMAL LOW (ref 39.0–52.0)
HEMOGLOBIN: 11.7 g/dL — AB (ref 13.0–17.0)
Lymphocytes Relative: 18 %
Lymphs Abs: 1.1 10*3/uL (ref 0.7–4.0)
MCH: 26.7 pg (ref 26.0–34.0)
MCHC: 31.7 g/dL (ref 30.0–36.0)
MCV: 84.2 fL (ref 78.0–100.0)
Monocytes Absolute: 0.9 10*3/uL (ref 0.1–1.0)
Monocytes Relative: 15 %
Neutro Abs: 3.8 10*3/uL (ref 1.7–7.7)
Neutrophils Relative %: 64 %
Platelets: 391 10*3/uL (ref 150–400)
RBC: 4.38 MIL/uL (ref 4.22–5.81)
RDW: 12.2 % (ref 11.5–15.5)
WBC: 6 10*3/uL (ref 4.0–10.5)

## 2015-03-15 MED ORDER — METHOCARBAMOL 750 MG PO TABS: 750.0000 mg | ORAL_TABLET | Freq: Four times a day (QID) | ORAL | Status: DC

## 2015-03-15 MED ORDER — KETOROLAC TROMETHAMINE 30 MG/ML IJ SOLN
30.0000 mg | Freq: Once | INTRAMUSCULAR | Status: AC
  Administered 2015-03-15: 30 mg via INTRAVENOUS
  Filled 2015-03-15: qty 1

## 2015-03-15 NOTE — Discharge Instructions (Signed)

## 2015-03-15 NOTE — ED Provider Notes (Signed)
CSN: [id number]     Arrival date & time 03/15/15  4098 History   First MD Initiated Contact with Patient 03/15/15 [phone]     Chief Complaint  Patient presents with  . Chest Pain  . Flank Pain     (Consider location/radiation/quality/duration/timing/severity/associated sxs/prior Treatment) Patient is a 24 y.o. male presenting with chest pain and flank pain.  Chest Pain Pain location:  Substernal area Pain quality comment:  Like a muscle pull Pain radiates to:  Does not radiate Pain radiates to the back: no   Pain severity:  Moderate Onset quality:  Gradual Timing:  Intermittent Progression:  Waxing and waning Chronicity:  New Context: breathing, lifting, movement and raising an arm   Relieved by:  Nothing Worsened by:  Nothing tried Ineffective treatments:  None tried Associated symptoms: nausea and shortness of breath   Associated symptoms: no abdominal pain, no back pain, no cough, no diaphoresis, no fatigue, no fever, no headache, no lower extremity edema and not vomiting   Risk factors: hypertension   Risk factors: no coronary artery disease, no diabetes mellitus, no high cholesterol, no immobilization, no prior DVT/PE, no smoking and no surgery   Risk factors comment:  Fam hx, father MI age 4 Flank Pain This is a new problem. Episode onset: 5 days ago. Episode frequency: coming and going. The problem has been gradually worsening. Associated symptoms include chest pain (there just when changing positions/moving, getting in and out of bed. not exertional.) and shortness of breath. Pertinent negatives include no abdominal pain and no headaches. Exacerbated by: worse with coughing, deep breaths, bending down to pick something up.    Past Medical History  Diagnosis Date  . Crohn disease   . Hypertension    History reviewed. No pertinent past surgical history. Family History  Problem Relation Age of Onset  . Heart failure Father    Social History  Substance Use Topics  .  Smoking status: Never Smoker   . Smokeless tobacco: Never Used  . Alcohol Use: No    Review of Systems  Constitutional: Negative for fever, diaphoresis and fatigue.  HENT: Negative for sore throat.   Eyes: Negative for visual disturbance.  Respiratory: Positive for shortness of breath. Negative for cough.   Cardiovascular: Positive for chest pain (there just when changing positions/moving, getting in and out of bed. not exertional.). Negative for leg swelling.  Gastrointestinal: Positive for nausea. Negative for vomiting, abdominal pain, diarrhea and constipation.  Genitourinary: Positive for flank pain. Negative for difficulty urinating.  Musculoskeletal: Negative for back pain and neck stiffness.  Skin: Negative for rash.  Neurological: Negative for syncope and headaches.      Allergies  Prednisone  Home Medications   Prior to Admission medications   Medication Sig Start Date End Date Taking? Authorizing Provider  atenolol (TENORMIN) 50 MG tablet Take 50 mg by mouth 2 (two) times daily.   Yes Historical Provider, MD  azaTHIOprine (IMURAN) 50 MG tablet Take 150 mg by mouth daily.  10/01/14  Yes Historical Provider, MD  InFLIXimab (REMICADE IV) Inject into the vein every 8 (eight) weeks.    Yes Historical Provider, MD  ketorolac (TORADOL) 10 MG tablet Take 1 tablet (10 mg total) by mouth every 6 (six) hours as needed. 02/17/15  Yes Barrett Henle, PA-C  Multiple Vitamin (MULTIVITAMIN) tablet Take 1 tablet by mouth daily.   Yes Historical Provider, MD  methocarbamol (ROBAXIN-750) 750 MG tablet Take 1 tablet (750 mg total) by mouth 4 (four) times  daily. 03/15/15   Alvira Monday, MD   BP 123/76 mmHg  Pulse 67  Temp(Src) 97.5 F (36.4 C) (Oral)  Resp 16  Ht 5\' 6"  (1.676 m)  Wt 184 lb (83.462 kg)  BMI 29.71 kg/m2  SpO2 97% Physical Exam  Constitutional: He is oriented to person, place, and time. He appears well-developed and well-nourished. No distress.  HENT:  Head:  Normocephalic and atraumatic.  Eyes: Conjunctivae and EOM are normal.  Neck: Normal range of motion.  Cardiovascular: Normal rate, regular rhythm, normal heart sounds and intact distal pulses.  Exam reveals no gallop and no friction rub.   No murmur heard. Pulmonary/Chest: Effort normal and breath sounds normal. No respiratory distress. He has no wheezes. He has no rales.  Abdominal: Soft. He exhibits no distension. There is no tenderness. There is no guarding.  Musculoskeletal: He exhibits no edema.  Neurological: He is alert and oriented to person, place, and time.  Skin: Skin is warm and dry. He is not diaphoretic.  Nursing note and vitals reviewed.   ED Course  Procedures (including critical care time) Labs Review Labs Reviewed  COMPREHENSIVE METABOLIC PANEL - Abnormal; Notable for the following:    Albumin 3.3 (*)    ALT 14 (*)    All other components within normal limits  CBC WITH DIFFERENTIAL/PLATELET - Abnormal; Notable for the following:    Hemoglobin 11.7 (*)    HCT 36.9 (*)    All other components within normal limits  I-STAT TROPOININ, ED  Rosezena Sensor, ED    Imaging Review Dg Chest 2 View  03/15/2015   CLINICAL DATA:  Left mid chest pain x2 days  EXAM: CHEST - 2 VIEW  COMPARISON:  02/15/2015  FINDINGS: Lungs are clear. Heart size and mediastinal contours are within normal limits. No effusion.  No pneumothorax. Visualized skeletal structures are unremarkable.  IMPRESSION: No acute cardiopulmonary disease.   Electronically Signed   By: Corlis Leak M.D.   On: 03/15/2015 10:18   I have personally reviewed and evaluated these images and lab results as part of my medical decision-making.   EKG Interpretation   Date/Time:  Friday March 15 2015 09:36:57 EDT Ventricular Rate:  71 PR Interval:  162 QRS Duration: 71 QT Interval:  353 QTC Calculation: 383 R Axis:   64 Text Interpretation:  Sinus arrhythmia ST elev, probable normal early  repol pattern No  significant change since last tracing Confirmed by  [facility] MD, ERIN ([id number]) on 03/15/2015 9:40:06 AM      MDM   Final diagnoses:  Chest pain, unspecified chest pain type    24 year old male with history of Crohn's disease, hypertension presents with concern of chest pain. Differential diagnosis for chest pain includes pulmonary embolus, dissection, pneumothorax, pneumonia, ACS, myocarditis, pericarditis.  EKG was done and evaluate by me and showed benign early repol similar to prior.  Chest x-ray was done and evaluated by me and radiology and showed no sign of pneumonia or pneumothorax. Patient is PERC negative and low risk [place] and have low suspicion for PE.  Patient is low risk HEART score and had delta troponins which were both negative. Given this evaluation, history and physical have low suspicion for pulmonary embolus, pneumonia, ACS, myocarditis, pericarditis, dissection.   Patient most likely with musculoskeletal chest pain and flank pain given pain with palpation as well as movements. Given Toradol for pain in the ED with improvement.  GIven bilateral nature, quality of pain, low susp for pyelo/nephrolithiasis and doubt intraabdominal  cause given no abd pain or tenderness.  Patient discharged in stable condition with understanding of reasons to return and recommend PCP follow up.     Alvira Monday, MD 03/16/15 1427

## 2015-03-15 NOTE — ED Notes (Signed)
Pt start having flank pain on 9/22 and has had it intermittently up to today.  The chest pain onset is when he reaches across with his right hand to his left side and reports a pain level of 6 when it occurs.  The flank pain rates in at a 7 and it is also intermittent.  Pt denies any sports injuries or occassions of possible muscle pulls.

## 2016-07-24 ENCOUNTER — Encounter: Payer: Self-pay | Admitting: *Deleted

## 2016-07-24 ENCOUNTER — Emergency Department (INDEPENDENT_AMBULATORY_CARE_PROVIDER_SITE_OTHER)
Admission: EM | Admit: 2016-07-24 | Discharge: 2016-07-24 | Disposition: A | Discharge status: Home / Self Care | Payer: BLUE CROSS/BLUE SHIELD | Source: Home / Self Care | Attending: Family Medicine | Admitting: Family Medicine

## 2016-07-24 DIAGNOSIS — N5082 Scrotal pain: Secondary | ICD-10-CM

## 2016-07-24 DIAGNOSIS — Z711 Person with feared health complaint in whom no diagnosis is made: Secondary | ICD-10-CM

## 2016-07-24 DIAGNOSIS — N342 Other urethritis: Secondary | ICD-10-CM

## 2016-07-24 DIAGNOSIS — R300 Dysuria: Secondary | ICD-10-CM

## 2016-07-24 LAB — POCT URINALYSIS DIP (MANUAL ENTRY)
Bilirubin, UA: NEGATIVE
Blood, UA: NEGATIVE
Glucose, UA: NEGATIVE
Ketones, POC UA: NEGATIVE
Leukocytes, UA: NEGATIVE
Nitrite, UA: NEGATIVE
Protein Ur, POC: NEGATIVE
Spec Grav, UA: 1.02 (ref 1.005–1.03)
Urobilinogen, UA: 0.2 (ref 0–1)
pH, UA: 6.5 (ref 5–8)

## 2016-07-24 MED ORDER — CEFTRIAXONE SODIUM 250 MG IJ SOLR
250.0000 mg | Freq: Once | INTRAMUSCULAR | Status: AC
  Administered 2016-07-24: 250 mg via INTRAMUSCULAR

## 2016-07-24 MED ORDER — DOXYCYCLINE HYCLATE 100 MG PO CAPS: 100.0000 mg | ORAL_CAPSULE | Freq: Two times a day (BID) | ORAL | 0 refills | Status: DC

## 2016-07-24 NOTE — ED Triage Notes (Signed)
Pt c/o dysuria, LT flank pain, swollen LT testicle, and clear penile d/c x 3 days.

## 2016-07-24 NOTE — ED Provider Notes (Signed)
CSN: [id number]     Arrival date & time 07/24/16  1538 History   First MD Initiated Contact with Patient 07/24/16 1605     Chief Complaint  Patient presents with  . Dysuria   (Consider location/radiation/quality/duration/timing/severity/associated sxs/prior Treatment) HPI Chad Reese is a 26 y.o. male presenting to UC with c/o Left flank pain, Left swollen testicle with tenderness and clear penile discharge for 3 days.  Mild lower abdominal pain. Hx of unprotected intercourse but no specific known exposure to STDs. Denies fever, chills, n/v/d. He has not taken anything for his symptoms. Denies blood in urine. No hx of similar symptoms.   Past Medical History:  Diagnosis Date  . Crohn disease ([facility])   . Hypertension    History reviewed. No pertinent surgical history. Family History  Problem Relation Age of Onset  . Heart failure Father    Social History  Substance Use Topics  . Smoking status: Never Smoker  . Smokeless tobacco: Never Used  . Alcohol use Yes     Comment: 1 q wk    Review of Systems  Gastrointestinal: Positive for abdominal pain. Negative for diarrhea, nausea and vomiting.  Genitourinary: Positive for dysuria, flank pain and testicular pain. Negative for frequency, genital sores, hematuria and urgency.  Musculoskeletal: Positive for back pain. Negative for myalgias.  Skin: Negative for rash.    Allergies  Prednisone  Home Medications   Prior to Admission medications   Medication Sig Start Date End Date Taking? Authorizing Provider  atenolol (TENORMIN) 50 MG tablet Take 50 mg by mouth 2 (two) times daily.   Yes Historical Provider, MD  InFLIXimab (REMICADE IV) Inject into the vein every 8 (eight) weeks.    Yes Historical Provider, MD  doxycycline (VIBRAMYCIN) 100 MG capsule Take 1 capsule (100 mg total) by mouth 2 (two) times daily. X 10 days 07/24/16   Junius Finner, PA-C  Multiple Vitamin (MULTIVITAMIN) tablet Take 1 tablet by mouth daily.    Historical  Provider, MD   Meds Ordered and Administered this Visit   Medications  cefTRIAXone (ROCEPHIN) injection 250 mg (250 mg Intramuscular Given 07/24/16 1613)    BP 166/95 (BP Location: Left Arm)   Pulse 81   Temp 98.3 F (36.8 C) (Oral)   Resp 18   Ht 5\' 6"  (1.676 m)   Wt 192 lb (87.1 kg)   SpO2 98%   BMI 30.99 kg/m  No data found.   Physical Exam  Constitutional: He is oriented to person, place, and time. He appears well-developed and well-nourished.  HENT:  Head: Normocephalic and atraumatic.  Eyes: EOM are normal.  Neck: Normal range of motion.  Cardiovascular: Normal rate.   Pulmonary/Chest: Effort normal. No respiratory distress.  Abdominal: Soft. He exhibits no distension and no mass. There is no tenderness. There is no rebound, no guarding and no CVA tenderness. No hernia.  Genitourinary:  Genitourinary Comments: Chaperoned exam, Christy Locklear-LPN. No obvious swelling of scrotum. Tenderness of Left testicle. No erythema or lesions. No penile discharge.   Musculoskeletal: Normal range of motion.  Neurological: He is alert and oriented to person, place, and time.  Skin: Skin is warm and dry.  Psychiatric: He has a normal mood and affect. His behavior is normal.  Nursing note and vitals reviewed.   Urgent Care Course     Procedures (including critical care time)  Labs Review Labs Reviewed  GC/CHLAMYDIA PROBE AMP  POCT URINALYSIS DIP (MANUAL ENTRY)    Imaging Review No results found.  MDM   1. Urethritis   2. Dysuria   3. Scrotal pain   4. Concern about STD in male without diagnosis    Pt c/o 3 days of gradually worsening dysuria and left side testicular pain. No edema noted on exam. No skin lesions. Due to duration of symptoms, symptoms most c/w STD and subsequent epididymitis rather than testicular torsion.   Urine sent to lab for GC/Chlamydia testing. Pt would like to start empiric treatment today.  Rocephin 250mg  IM in UC Rx: Doxycycline 100mg   BID for 10 days F/u with PCP in 5-7 days if not improving, sooner if worsening.    Junius Finner, PA-C 07/24/16 1628

## 2016-07-24 NOTE — Discharge Instructions (Signed)
°  Refrain from sexual intercourse for 7 days. Be sure to have all partners tested and treated for STDs.  Practice safe sex by always wearing condoms.  ° °

## 2016-07-27 ENCOUNTER — Telehealth: Payer: Self-pay | Admitting: *Deleted

## 2016-07-27 LAB — GC/CHLAMYDIA PROBE AMP
CT Probe RNA: NOT DETECTED
GC Probe RNA: NOT DETECTED

## 2016-07-27 NOTE — Telephone Encounter (Signed)
Callback: Password [id number] received. Lab results given and discussed. His symptoms are improving.

## 2018-04-20 ENCOUNTER — Other Ambulatory Visit: Payer: Self-pay

## 2018-04-20 ENCOUNTER — Encounter (HOSPITAL_COMMUNITY): Payer: Self-pay | Admitting: Emergency Medicine

## 2018-04-20 ENCOUNTER — Emergency Department (HOSPITAL_COMMUNITY): Payer: BLUE CROSS/BLUE SHIELD

## 2018-04-20 ENCOUNTER — Emergency Department (HOSPITAL_COMMUNITY)
Admission: EM | Admit: 2018-04-20 | Discharge: 2018-04-20 | Disposition: A | Discharge status: Home / Self Care | Payer: BLUE CROSS/BLUE SHIELD | Attending: Emergency Medicine | Admitting: Emergency Medicine

## 2018-04-20 DIAGNOSIS — Z79899 Other long term (current) drug therapy: Secondary | ICD-10-CM | POA: Insufficient documentation

## 2018-04-20 DIAGNOSIS — R0789 Other chest pain: Secondary | ICD-10-CM | POA: Diagnosis not present

## 2018-04-20 DIAGNOSIS — I10 Essential (primary) hypertension: Secondary | ICD-10-CM | POA: Diagnosis not present

## 2018-04-20 LAB — I-STAT TROPONIN, ED: Troponin i, poc: 0 ng/mL (ref 0.00–0.08)

## 2018-04-20 LAB — CBC
HCT: 46.9 % (ref 39.0–52.0)
Hemoglobin: 14.3 g/dL (ref 13.0–17.0)
MCH: 26.5 pg (ref 26.0–34.0)
MCHC: 30.5 g/dL (ref 30.0–36.0)
MCV: 87 fL (ref 80.0–100.0)
Platelets: 310 10*3/uL (ref 150–400)
RBC: 5.39 MIL/uL (ref 4.22–5.81)
RDW: 12.2 % (ref 11.5–15.5)
WBC: 5.8 10*3/uL (ref 4.0–10.5)
nRBC: 0 % (ref 0.0–0.2)

## 2018-04-20 LAB — BASIC METABOLIC PANEL
Anion gap: 10 (ref 5–15)
BUN: 10 mg/dL (ref 6–20)
CO2: 24 mmol/L (ref 22–32)
Calcium: 9.2 mg/dL (ref 8.9–10.3)
Chloride: 103 mmol/L (ref 98–111)
Creatinine, Ser: 1.01 mg/dL (ref 0.61–1.24)
GFR calc Af Amer: >60 mL/min (ref >60)
GFR calc non Af Amer: >60 mL/min (ref >60)
Glucose, Bld: 107 mg/dL — ABNORMAL HIGH (ref 70–99)
Potassium: 3.6 mmol/L (ref 3.5–5.1)
Sodium: 137 mmol/L (ref 135–145)

## 2018-04-20 MED ORDER — AMLODIPINE BESYLATE 5 MG PO TABS: 5.0000 mg | ORAL_TABLET | Freq: Every day | ORAL | 0 refills | Status: DC

## 2018-04-20 NOTE — ED Notes (Signed)
Patient verbalizes understanding of discharge instructions. Opportunity for questioning and answers were provided. Armband removed by staff, pt discharged from ED. Pt ambulatory to lobby.  

## 2018-04-20 NOTE — ED Triage Notes (Signed)
Pt BIB [facility], c/o chest pain that started at 1630 upon waking. Pt started taking amlodipine this am for HTN. Also reports headache, and dizziness. EMS vitals: BP 170/100, HR 90

## 2018-04-20 NOTE — Discharge Instructions (Signed)
Your blood pressure medicine might be slightly too strong for you. Decrease norvasc to 5 mg daily   See your doctor in a week to recheck blood pressure   Return to ER if you have worse chest pain, dizziness, passing out.

## 2018-04-20 NOTE — ED Provider Notes (Signed)
[facility] EM[facility]ENC[facility] DEPART[facility] Provider Note   CSN: [id number] Arrival date & time: 04/20/18  1923     History   Chief Complaint Chief Complaint  Patient presents with  . Chest Pain    HPI Chad Reese is a 27 y.o. male hx HTN, Crohn's disease, here presenting with chest pain, dizziness.  Patient went to see his primary care doctor yesterday for a physical exam.  Patient was on atenolol 100 mg daily but had some sexual dysfunction so he was changed to Norvasc 10 mg and took a dose today.  He states that after taking a dose he feels lightheaded and dizzy and has some headaches.  He also picked up his son around 4:30 and then had some chest pressure.  He came by EMS and was noted to be hypertensive with blood pressure 170/100. During triage, his BP was 130/80.   The history is provided by the patient.    Past Medical History:  Diagnosis Date  . Crohn disease ([place])   . Hypertension     There are no active problems to display for this patient.   History reviewed. No pertinent surgical history.      Home Medications    Prior to Admission medications   Medication Sig Start Date End Date Taking? Authorizing Provider  atenolol (TENORMIN) 50 MG tablet Take 50 mg by mouth 2 (two) times daily.    [provider]  doxycycline (VIBRAMYCIN) 100 MG capsule Take 1 capsule (100 mg total) by mouth 2 (two) times daily. X 10 days 07/24/16   Noe Gens, PA-C  InFLIXimab (REMICADE IV) Inject into the vein every 8 (eight) weeks.     [provider]  Multiple Vitamin (MULTIVITAMIN) tablet Take 1 tablet by mouth daily.    [provider]    Family History Family History  Problem Relation Age of Onset  . Heart failure Father     Social History Social History   Tobacco Use  . Smoking status: Never Smoker  . Smokeless tobacco: Never Used  Substance Use Topics  . Alcohol use: Yes    Comment: 1 q wk  . Drug use: No     Allergies     Prednisone   Review of Systems Review of Systems  Cardiovascular: Positive for chest pain.  All other systems reviewed and are negative.    Physical Exam Updated Vital Signs BP 130/84   Pulse 84   Temp 98.4 F (36.9 C) (Oral)   Resp 16   SpO2 97%   Physical Exam  Constitutional: He is oriented to person, place, and time. He appears well-developed.  HENT:  Head: Normocephalic.  Eyes: Pupils are equal, round, and reactive to light.  Neck: Normal range of motion.  Cardiovascular: Normal rate, regular rhythm and normal pulses.  Pulmonary/Chest: Effort normal and breath sounds normal.  Abdominal: Soft. Bowel sounds are normal.  Musculoskeletal: Normal range of motion.       Right lower leg: Normal.       Left lower leg: Normal.  Neurological: He is alert and oriented to person, place, and time.  Skin: Skin is warm. Capillary refill takes less than 2 seconds.  Psychiatric: He has a normal mood and affect. His behavior is normal.  Nursing note and vitals reviewed.    ED Treatments / Results  Labs (all labs ordered are listed, but only abnormal results are displayed) Labs Reviewed  BASIC METABOLIC PANEL - Abnormal; Notable for the following  components:      Result Value   Glucose, Bld 107 (*)    All other components within normal limits  CBC  I-STAT TROPONIN, ED    EKG EKG Interpretation  Date/Time:  Wednesday April 20 2018 19:31:33 EST Ventricular Rate:  82 PR Interval:  156 QRS Duration: 78 QT Interval:  352 QTC Calculation: 411 R Axis:   56 Text Interpretation:  Normal sinus rhythm Nonspecific ST abnormality Abnormal ECG No significant change since last tracing Confirmed by Wandra Arthurs [phone]) on 04/20/2018 8:03:17 PM   Radiology No results found.  Procedures Procedures (including critical care time)  Medications Ordered in ED Medications - No data to display   Initial Impression / Assessment and Plan / ED Course  I have reviewed the triage  vital signs and the nursing notes.  Pertinent labs & imaging results that were available during my care of the patient were reviewed by me and considered in my medical decision making (see chart for details).    Chad Reese is a 27 y.o. male here with chest pain. Patient's BP med was changed from atenolol to norvasc. I think dizziness, chest pain likely side effect from changing BP meds. Will get labs, orthostatics. Low suspicion for ACS so trop x 1 sufficient and I doubt PE or dissection.   10:12 PM Labs unremarkable. Not orthostatic. Will decrease norvasc to 5 mg daily and have him follow up with PCP.    Final Clinical Impressions(s) / ED Diagnoses   Final diagnoses:  None    ED Discharge Orders    None       Drenda Freeze, MD 04/20/18 2219

## 2018-09-06 ENCOUNTER — Encounter (HOSPITAL_COMMUNITY): Payer: Self-pay | Admitting: *Deleted

## 2018-09-06 ENCOUNTER — Emergency Department (HOSPITAL_COMMUNITY): Payer: Self-pay

## 2018-09-06 ENCOUNTER — Observation Stay (HOSPITAL_COMMUNITY)
Admission: EM | Admit: 2018-09-06 | Discharge: 2018-09-07 | Disposition: A | Discharge status: Home / Self Care | Payer: Self-pay | Attending: Internal Medicine | Admitting: Internal Medicine

## 2018-09-06 ENCOUNTER — Other Ambulatory Visit: Payer: Self-pay

## 2018-09-06 DIAGNOSIS — R7989 Other specified abnormal findings of blood chemistry: Secondary | ICD-10-CM | POA: Insufficient documentation

## 2018-09-06 DIAGNOSIS — I10 Essential (primary) hypertension: Secondary | ICD-10-CM | POA: Insufficient documentation

## 2018-09-06 DIAGNOSIS — R072 Precordial pain: Secondary | ICD-10-CM

## 2018-09-06 DIAGNOSIS — Z791 Long term (current) use of non-steroidal anti-inflammatories (NSAID): Secondary | ICD-10-CM | POA: Insufficient documentation

## 2018-09-06 DIAGNOSIS — Z888 Allergy status to other drugs, medicaments and biological substances status: Secondary | ICD-10-CM | POA: Insufficient documentation

## 2018-09-06 DIAGNOSIS — K5090 Crohn's disease, unspecified, without complications: Secondary | ICD-10-CM | POA: Diagnosis present

## 2018-09-06 DIAGNOSIS — R778 Other specified abnormalities of plasma proteins: Secondary | ICD-10-CM | POA: Diagnosis present

## 2018-09-06 DIAGNOSIS — Z7982 Long term (current) use of aspirin: Secondary | ICD-10-CM | POA: Insufficient documentation

## 2018-09-06 DIAGNOSIS — J020 Streptococcal pharyngitis: Principal | ICD-10-CM | POA: Insufficient documentation

## 2018-09-06 DIAGNOSIS — Z79899 Other long term (current) drug therapy: Secondary | ICD-10-CM | POA: Insufficient documentation

## 2018-09-06 LAB — CBC WITH DIFFERENTIAL/PLATELET
Abs Immature Granulocytes: 0.01 10*3/uL (ref 0.00–0.07)
Basophils Absolute: 0 10*3/uL (ref 0.0–0.1)
Basophils Relative: 1 %
EOS ABS: 0.2 10*3/uL (ref 0.0–0.5)
Eosinophils Relative: 4 %
HCT: 45.5 % (ref 39.0–52.0)
Hemoglobin: 14.9 g/dL (ref 13.0–17.0)
IMMATURE GRANULOCYTES: 0 %
Lymphocytes Relative: 41 %
Lymphs Abs: 2.3 10*3/uL (ref 0.7–4.0)
MCH: 28.8 pg (ref 26.0–34.0)
MCHC: 32.7 g/dL (ref 30.0–36.0)
MCV: 87.8 fL (ref 80.0–100.0)
MONOS PCT: 13 %
Monocytes Absolute: 0.7 10*3/uL (ref 0.1–1.0)
NEUTROS ABS: 2.3 10*3/uL (ref 1.7–7.7)
NEUTROS PCT: 41 %
NRBC: 0 % (ref 0.0–0.2)
Platelets: 322 10*3/uL (ref 150–400)
RBC: 5.18 MIL/uL (ref 4.22–5.81)
RDW: 12.4 % (ref 11.5–15.5)
WBC: 5.6 10*3/uL (ref 4.0–10.5)

## 2018-09-06 LAB — BASIC METABOLIC PANEL
Anion gap: 6 (ref 5–15)
BUN: 13 mg/dL (ref 6–20)
CHLORIDE: 105 mmol/L (ref 98–111)
CO2: 25 mmol/L (ref 22–32)
Calcium: 9.2 mg/dL (ref 8.9–10.3)
Creatinine, Ser: 0.91 mg/dL (ref 0.61–1.24)
GFR calc Af Amer: >60 mL/min (ref >60)
GLUCOSE: 105 mg/dL — AB (ref 70–99)
Potassium: 3.9 mmol/L (ref 3.5–5.1)
SODIUM: 136 mmol/L (ref 135–145)

## 2018-09-06 LAB — D-DIMER, QUANTITATIVE (NOT AT ARMC): D DIMER QUANT: 0.28 ug{FEU}/mL (ref 0.00–0.50)

## 2018-09-06 LAB — TROPONIN I: TROPONIN I: 0.44 ng/mL — AB (ref <0.03)

## 2018-09-06 LAB — GROUP A STREP BY PCR: Group A Strep by PCR: DETECTED — AB

## 2018-09-06 MED ORDER — PENICILLIN V POTASSIUM 500 MG PO TABS
500.0000 mg | ORAL_TABLET | Freq: Once | ORAL | Status: AC
  Administered 2018-09-06: 500 mg via ORAL
  Filled 2018-09-06: qty 1

## 2018-09-06 MED ORDER — ASPIRIN 81 MG PO CHEW
324.0000 mg | CHEWABLE_TABLET | Freq: Once | ORAL | Status: AC
  Administered 2018-09-06: 324 mg via ORAL
  Filled 2018-09-06: qty 4

## 2018-09-06 MED ORDER — ACETAMINOPHEN 650 MG RE SUPP: 650.0000 mg | Freq: Four times a day (QID) | RECTAL | Status: DC | PRN

## 2018-09-06 MED ORDER — ATENOLOL 50 MG PO TABS
50.0000 mg | ORAL_TABLET | Freq: Two times a day (BID) | ORAL | Status: DC
  Administered 2018-09-07 (×2): 50 mg via ORAL
  Filled 2018-09-06 (×2): qty 1

## 2018-09-06 MED ORDER — ONDANSETRON HCL 4 MG/2ML IJ SOLN: 4.0000 mg | Freq: Four times a day (QID) | INTRAMUSCULAR | Status: DC | PRN

## 2018-09-06 MED ORDER — MORPHINE SULFATE (PF) 4 MG/ML IV SOLN: 3.0000 mg | INTRAVENOUS | Status: DC | PRN

## 2018-09-06 MED ORDER — ENOXAPARIN SODIUM 40 MG/0.4ML ~~LOC~~ SOLN: 40.0000 mg | Freq: Every day | SUBCUTANEOUS | Status: DC

## 2018-09-06 MED ORDER — HYDROCODONE-ACETAMINOPHEN 5-325 MG PO TABS: 1.0000 | ORAL_TABLET | ORAL | Status: DC | PRN

## 2018-09-06 MED ORDER — POTASSIUM CHLORIDE IN NACL 20-0.45 MEQ/L-% IV SOLN
INTRAVENOUS | Status: AC
  Administered 2018-09-07: 02:00:00 via INTRAVENOUS
  Filled 2018-09-06: qty 1000

## 2018-09-06 MED ORDER — ACETAMINOPHEN 325 MG PO TABS: 650.0000 mg | ORAL_TABLET | Freq: Four times a day (QID) | ORAL | Status: DC | PRN

## 2018-09-06 MED ORDER — SODIUM CHLORIDE 0.9% FLUSH
3.0000 mL | Freq: Two times a day (BID) | INTRAVENOUS | Status: DC
  Administered 2018-09-07 (×2): 3 mL via INTRAVENOUS

## 2018-09-06 MED ORDER — PENICILLIN V POTASSIUM 250 MG PO TABS
500.0000 mg | ORAL_TABLET | Freq: Three times a day (TID) | ORAL | Status: DC
  Administered 2018-09-07: 500 mg via ORAL
  Filled 2018-09-06 (×3): qty 2

## 2018-09-06 MED ORDER — ONDANSETRON HCL 4 MG PO TABS: 4.0000 mg | ORAL_TABLET | Freq: Four times a day (QID) | ORAL | Status: DC | PRN

## 2018-09-06 NOTE — ED Notes (Signed)
Received call from [facility] with lab stating pt has Troponin of 0.44.  Dr. Rudi Heap, RN notified.

## 2018-09-06 NOTE — ED Provider Notes (Signed)
Emergency Department Provider Note   I have reviewed the triage vital signs and the nursing notes.   HISTORY  Chief Complaint Sore Throat and Cough   HPI Chad Reese is a 28 y.o. male with PMH of HTN and Crohn's disease presents to the emergency department for evaluation of cough, sore throat, intermittent chest discomfort.  Symptoms have been ongoing for the past 2 days.  He denies fevers or travel.  No sick contacts.  Cough is nonproductive.  His chest pain seems mainly with exertion.  Describes it as central and pressure-like.  No active pain symptoms. No radiation of symptoms. No difficulty swallowing but does have some pain. No abdominal pain for GI symptoms.   Past Medical History:  Diagnosis Date  . Crohn disease ([place])   . Hypertension     Patient Active Problem List   Diagnosis Date Noted  . Elevated troponin 09/06/2018    History reviewed. No pertinent surgical history.  Allergies Prednisone  Family History  Problem Relation Age of Onset  . Heart failure Father     Social History Social History   Tobacco Use  . Smoking status: Never Smoker  . Smokeless tobacco: Never Used  Substance Use Topics  . Alcohol use: Yes    Comment: 1 q wk  . Drug use: No    Review of Systems   Constitutional: No fever/chills Eyes: No visual changes. ENT: Positive sore throat. Cardiovascular: Positive chest pain. Respiratory: Positive shortness of breath and cough.  Gastrointestinal: No abdominal pain.  No nausea, no vomiting.  No diarrhea.  No constipation. Genitourinary: Negative for dysuria. Musculoskeletal: Negative for back pain. Skin: Negative for rash. Neurological: Negative for headaches, focal weakness or numbness.  10-point ROS otherwise negative.  ____________________________________________   PHYSICAL EXAM:  VITAL SIGNS: ED Triage Vitals  Enc Vitals Group     BP 09/06/18 1943 (!) 135/101     Pulse Rate 09/06/18 1943 75     Resp 09/06/18  1943 18     Temp 09/06/18 1943 98.7 F (37.1 C)     Temp Source 09/06/18 1943 Oral     SpO2 09/06/18 1943 97 %     Weight 09/06/18 1948 205 lb (93 kg)     Height 09/06/18 1948 5' 6.5" (1.689 m)   Constitutional: Alert and oriented. Well appearing and in no acute distress. Eyes: Conjunctivae are normal. Head: Atraumatic. Nose: No congestion/rhinnorhea. Mouth/Throat: Mucous membranes are moist.  Oropharynx with bilateral tonsillar hypertrophy without exudate. No PTA. Managing oral secretions and speaking in a clear voice.  Neck: No stridor.  No meningeal signs.   Cardiovascular: Normal rate, regular rhythm. Good peripheral circulation. Grossly normal heart sounds.   Respiratory: Normal respiratory effort.  No retractions. Lungs CTAB. Gastrointestinal: Soft and nontender. No distention.  Musculoskeletal: No lower extremity tenderness nor edema. No gross deformities of extremities. Neurologic:  Normal speech and language. No gross focal neurologic deficits are appreciated.  Skin:  Skin is warm, dry and intact. No rash noted.  ____________________________________________   LABS (all labs ordered are listed, but only abnormal results are displayed)  Labs Reviewed  GROUP A STREP BY PCR - Abnormal; Notable for the following components:      Result Value   Group A Strep by PCR DETECTED (*)    All other components within normal limits  BASIC METABOLIC PANEL - Abnormal; Notable for the following components:   Glucose, Bld 105 (*)    All other components within normal limits  TROPONIN I - Abnormal; Notable for the following components:   Troponin I 0.44 (*)    All other components within normal limits  CBC WITH DIFFERENTIAL/PLATELET  D-DIMER, QUANTITATIVE (NOT AT [facility])   ____________________________________________  EKG   EKG Interpretation  Date/Time:  Tuesday September 06 2018 20:54:30 EDT Ventricular Rate:  71 PR Interval:    QRS Duration: 74 QT Interval:  378 QTC Calculation:  411 R Axis:   64 Text Interpretation:  Sinus rhythm ST elev, probable normal early repol pattern No STEMI.  Similar to Nov 2019 tracing.  Confirmed by Nanda Quinton [id number]) on 09/06/2018 9:06:46 PM       ____________________________________________  RADIOLOGY  Dg Chest Portable 1 View  Result Date: 09/06/2018 CLINICAL DATA:  28 year old male with cough and sore throat for 4 days. EXAM: PORTABLE CHEST 1 VIEW COMPARISON:  04/20/2018 and earlier. FINDINGS: Portable AP upright view at 2037 hours. Normal lung volumes and mediastinal contours. Visualized tracheal air column is within normal limits. Allowing for portable technique the lungs are clear. No pneumothorax or pleural effusion. Negative visible bowel gas pattern. No osseous abnormality identified. IMPRESSION: Negative.  No cardiopulmonary abnormality. Electronically Signed   By: Genevie Ann M.D.   On: 09/06/2018 20:48    ____________________________________________   PROCEDURES  Procedure(s) performed:   Procedures  None  ____________________________________________   INITIAL IMPRESSION / ASSESSMENT AND PLAN / ED COURSE  Pertinent labs & imaging results that were available during my care of the patient were reviewed by me and considered in my medical decision making (see chart for details).  Patient presents to the emergency department with mild URI symptoms.  No fever.  He does describe some central chest pressure with exertion.  I have sent labs including troponin.  EKG shows LVH changes dense of acute ischemia.  EKG similar to prior tracings.  Plan for chest x-ray and reevaluate.  Patient is low risk for COVID. Plan for 2 week self-isolation.   10:10 PM  Troponin elevated to 0.44. No active CP. Patient reports history of dad dying of AMI at age 17. Plan to speak with Cardiology. D-dimer added. No SOB symptoms.   Spoke with Dr. Hassell Done with Cardiology. Plan for Hospitalist admit for troponin trending and ECHO. Cardiology can  consult in the AM.   11:00 PM  Discussed patient's case with Hospitlaist, Dr. Myna Hidalgo to request admission. Patient and family (if present) updated with plan. Care transferred to Hospitalist service.  I reviewed all nursing notes, vitals, pertinent old records, EKGs, labs, imaging (as available).  ____________________________________________  FINAL CLINICAL IMPRESSION(S) / ED DIAGNOSES  Final diagnoses:  Precordial chest pain  Strep pharyngitis    MEDICATIONS GIVEN DURING THIS VISIT:  Medications  penicillin v potassium (VEETID) tablet 500 mg (has no administration in time range)  aspirin chewable tablet 324 mg (324 mg Oral Given 09/06/18 2223)    Note:  This document was prepared using Dragon voice recognition software and may include unintentional dictation errors.  Nanda Quinton, MD Emergency Medicine    Siraj Dermody, [facility], MD 09/06/18 [phone]

## 2018-09-06 NOTE — ED Triage Notes (Signed)
Pt c/o cough & sore throat x 4 days.  Pt stated "I've had 2 people from [employer] train me at work.  I've been having dizziness.  I requested an e-visit appt but haven't heard anything back."  Pt cough is non-productive.  Denies n/v/d.

## 2018-09-06 NOTE — H&P (Signed)
History and Physical    Chad Reese GBT:[id number] DOB: 04/21/1991 DOA: 09/06/2018  PCP: System, Provider Not In   Patient coming from: Home   Chief Complaint: Sore throat   HPI: Chad Reese is a 28 y.o. male with medical history significant for hypertension and Crohn's disease, now presenting to the emergency department for evaluation of sore throat.  Patient reports that he been in his usual state of health until approximately 4 days ago when he developed a sore throat with nonproductive cough and intermittent chest discomfort.  Symptoms have been slowly worsening.  He denies any fevers or chills.  Describes his chest discomfort as localized to the central chest, worse when going upstairs to his apartment, and better with rest.  He has not experienced this type of chest discomfort previously.  He believes that his father had a heart attack at age 34.  Reports that his Crohn's has been well controlled with Remicade.  ED Course: Upon arrival to the ED, patient is found to be afebrile, saturating well on room air, and hypertensive with diastolic pressure of 710.  EKG features a sinus rhythm with ST elevations that are similar to prior.  Chemistry panel and CBC are unremarkable.  D-dimer is negative.  Throat swab PCR is positive for group A strep.  Troponin is elevated to 0.44.  Patient was given 324 mg of aspirin, 500 mg penicillin, and cardiology was consulted by the emergency department physician and recommends a medical admission with cardiology to see in consultation in the morning.  Review of Systems:  All other systems reviewed and apart from HPI, are negative.  Past Medical History:  Diagnosis Date  . Crohn disease ([place])   . Hypertension     History reviewed. No pertinent surgical history.   reports that he has never smoked. He has never used smokeless tobacco. He reports current alcohol use. He reports that he does not use drugs.  Allergies  Allergen Reactions  .  Prednisone Nausea And Vomiting    Family History  Problem Relation Age of Onset  . Heart failure Father      Prior to Admission medications   Medication Sig Start Date End Date Taking? Authorizing Provider  atenolol (TENORMIN) 50 MG tablet Take 50 mg by mouth 2 (two) times daily.   Yes [provider]  InFLIXimab (REMICADE IV) Inject into the vein every 8 (eight) weeks.    Yes [provider]  Multiple Vitamin (MULTIVITAMIN) tablet Take 1 tablet by mouth daily.   Yes [provider]  Pseudoeph-Doxylamine-DM-APAP (NYQUIL PO) Take 30 mLs by mouth daily as needed (cold/flu).   Yes [provider]    Physical Exam: Vitals:   09/06/18 1943 09/06/18 1948 09/06/18 2100 09/06/18 2236  BP: (!) 135/101   (!) 153/115  Pulse: 75   66  Resp: 18  15 19   Temp: 98.7 F (37.1 C)     TempSrc: Oral     SpO2: 97%   97%  Weight:  93 kg    Height:  5' 6.5" (1.689 m)      Constitutional: NAD, calm  Eyes: PERTLA, lids and conjunctivae normal ENMT: Mucous membranes are moist. Posterior pharynx clear of any exudate or lesions.   Neck: normal, supple, no masses, no thyromegaly Respiratory: clear to auscultation bilaterally, no wheezing, no crackles. Normal respiratory effort.   Cardiovascular: S1 & S2 heard, regular rate and rhythm. No extremity edema.   Abdomen: No distension, no tenderness, soft. Bowel sounds  normal.  Musculoskeletal: no clubbing / cyanosis. No joint deformity upper and lower extremities.    Skin: no significant rashes, lesions, ulcers. Warm, dry, well-perfused. Neurologic: CN 2-12 grossly intact. Sensation intact. Strength 5/5 in all 4 limbs.  Psychiatric: Alert and oriented x 3. Calm, cooperative.    Labs on Admission: I have personally reviewed following labs and imaging studies  CBC: Recent Labs  Lab 09/06/18 2101  WBC 5.6  NEUTROABS 2.3  HGB 14.9  HCT 45.5  MCV 87.8  PLT 196   Basic Metabolic Panel: Recent Labs  Lab 09/06/18  2101  NA 136  K 3.9  CL 105  CO2 25  GLUCOSE 105*  BUN 13  CREATININE 0.91  CALCIUM 9.2   GFR: Estimated Creatinine Clearance: 132.6 mL/min (by C-G formula based on SCr of 0.91 mg/dL). Liver Function Tests: No results for input(s): AST, ALT, ALKPHOS, BILITOT, PROT, ALBUMIN in the last 168 hours. No results for input(s): LIPASE, AMYLASE in the last 168 hours. No results for input(s): AMMONIA in the last 168 hours. Coagulation Profile: No results for input(s): INR, PROTIME in the last 168 hours. Cardiac Enzymes: Recent Labs  Lab 09/06/18 2101  TROPONINI 0.44*   BNP (last 3 results) No results for input(s): PROBNP in the last 8760 hours. HbA1C: No results for input(s): HGBA1C in the last 72 hours. CBG: No results for input(s): GLUCAP in the last 168 hours. Lipid Profile: No results for input(s): CHOL, HDL, LDLCALC, TRIG, CHOLHDL, LDLDIRECT in the last 72 hours. Thyroid Function Tests: No results for input(s): TSH, T4TOTAL, FREET4, T3FREE, THYROIDAB in the last 72 hours. Anemia Panel: No results for input(s): VITAMINB12, FOLATE, FERRITIN, TIBC, IRON, RETICCTPCT in the last 72 hours. Urine analysis:    Component Value Date/Time   BILIRUBINUR negative 07/24/2016 1615   KETONESUR negative 07/24/2016 1615   PROTEINUR negative 07/24/2016 1615   UROBILINOGEN 0.2 07/24/2016 1615   NITRITE Negative 07/24/2016 1615   LEUKOCYTESUR Negative 07/24/2016 1615   Sepsis Labs: @LABRCNTIP (procalcitonin:4,lacticidven:4) ) Recent Results (from the past 240 hour(s))  Group A Strep by PCR     Status: Abnormal   Collection Time: 09/06/18  9:01 PM  Result Value Ref Range Status   Group A Strep by PCR DETECTED (A) NOT DETECTED Final    Comment: Performed at [facility], [place] [address], [place], [place] [address]     Radiological Exams on Admission: Dg Chest Portable 1 View  Result Date: 09/06/2018 CLINICAL DATA:  28 year old male with cough and sore throat for 4  days. EXAM: PORTABLE CHEST 1 VIEW COMPARISON:  04/20/2018 and earlier. FINDINGS: Portable AP upright view at 2037 hours. Normal lung volumes and mediastinal contours. Visualized tracheal air column is within normal limits. Allowing for portable technique the lungs are clear. No pneumothorax or pleural effusion. Negative visible bowel gas pattern. No osseous abnormality identified. IMPRESSION: Negative.  No cardiopulmonary abnormality. Electronically Signed   By: Genevie Ann M.D.   On: 09/06/2018 20:48    EKG: Independently reviewed. Sinus rhythm, ST abnormalities similar to prior.   Assessment/Plan   1. Elevated troponin  - Presents with sore throat and intermittent chest discomfort, found to have streptococcal pharyngitis and troponin of 0.44  - EKG with ST abnormalities that do not appear significantly different than previous EKG  - Cardiology is consulting and much appreciated  - Continue cardiac monitoring, trend troponin, check echocardiogram   2. Streptococcal pharyngitis  - Presents with sore throat, swab positive for GAS  -  Treated with penicillin v in ED, will continue    3. Hypertension  - Continue beta-blocker   4. Crohn's disease  - Patient reports well-controlled on Remicade     DVT prophylaxis: Lovenox  Code Status: Full  Family Communication: Discussed with patient  Consults called: Cardiology  Admission status: Observation     Vianne Bulls, MD [facility] Pager [phone]  If 7PM-7AM, please contact night-coverage [email or web address] Password [facility]  09/06/2018, 11:14 PM

## 2018-09-07 ENCOUNTER — Ambulatory Visit (HOSPITAL_COMMUNITY)
Admit: 2018-09-07 | Discharge: 2018-09-07 | Disposition: A | Discharge status: Home / Self Care | Payer: Self-pay | Attending: Cardiovascular Disease | Admitting: Cardiovascular Disease

## 2018-09-07 ENCOUNTER — Other Ambulatory Visit (HOSPITAL_COMMUNITY): Payer: PRIVATE HEALTH INSURANCE

## 2018-09-07 DIAGNOSIS — I301 Infective pericarditis: Secondary | ICD-10-CM

## 2018-09-07 DIAGNOSIS — R072 Precordial pain: Secondary | ICD-10-CM

## 2018-09-07 DIAGNOSIS — I409 Acute myocarditis, unspecified: Secondary | ICD-10-CM | POA: Diagnosis not present

## 2018-09-07 DIAGNOSIS — R7989 Other specified abnormal findings of blood chemistry: Secondary | ICD-10-CM | POA: Diagnosis not present

## 2018-09-07 LAB — CBC WITH DIFFERENTIAL/PLATELET
ABS IMMATURE GRANULOCYTES: 0.02 10*3/uL (ref 0.00–0.07)
Basophils Absolute: 0 10*3/uL (ref 0.0–0.1)
Basophils Relative: 0 %
Eosinophils Absolute: 0.2 10*3/uL (ref 0.0–0.5)
Eosinophils Relative: 5 %
HCT: 45.9 % (ref 39.0–52.0)
Hemoglobin: 14.7 g/dL (ref 13.0–17.0)
Immature Granulocytes: 0 %
Lymphocytes Relative: 50 %
Lymphs Abs: 2.6 10*3/uL (ref 0.7–4.0)
MCH: 28.1 pg (ref 26.0–34.0)
MCHC: 32 g/dL (ref 30.0–36.0)
MCV: 87.8 fL (ref 80.0–100.0)
Monocytes Absolute: 0.6 10*3/uL (ref 0.1–1.0)
Monocytes Relative: 12 %
Neutro Abs: 1.7 10*3/uL (ref 1.7–7.7)
Neutrophils Relative %: 33 %
Platelets: 279 10*3/uL (ref 150–400)
RBC: 5.23 MIL/uL (ref 4.22–5.81)
RDW: 12.5 % (ref 11.5–15.5)
WBC: 5.2 10*3/uL (ref 4.0–10.5)
nRBC: 0 % (ref 0.0–0.2)

## 2018-09-07 LAB — BASIC METABOLIC PANEL
Anion gap: 8 (ref 5–15)
BUN: 12 mg/dL (ref 6–20)
CHLORIDE: 105 mmol/L (ref 98–111)
CO2: 24 mmol/L (ref 22–32)
Calcium: 9.2 mg/dL (ref 8.9–10.3)
Creatinine, Ser: 0.88 mg/dL (ref 0.61–1.24)
GFR calc Af Amer: >60 mL/min (ref >60)
GFR calc non Af Amer: >60 mL/min (ref >60)
GLUCOSE: 96 mg/dL (ref 70–99)
Potassium: 4.1 mmol/L (ref 3.5–5.1)
Sodium: 137 mmol/L (ref 135–145)

## 2018-09-07 LAB — TROPONIN I
Troponin I: 0.45 ng/mL (ref <0.03)
Troponin I: 0.46 ng/mL (ref <0.03)
Troponin I: 0.51 ng/mL (ref <0.03)

## 2018-09-07 LAB — HIV ANTIBODY (ROUTINE TESTING W REFLEX): HIV SCREEN 4TH GENERATION: NONREACTIVE

## 2018-09-07 MED ORDER — GADOBUTROL 1 MMOL/ML IV SOLN
10.0000 mL | Freq: Once | INTRAVENOUS | Status: AC | PRN
  Administered 2018-09-07: 10 mL via INTRAVENOUS

## 2018-09-07 MED ORDER — ASPIRIN EC 325 MG PO TBEC: 325.0000 mg | DELAYED_RELEASE_TABLET | Freq: Every day | ORAL | 0 refills | Status: AC

## 2018-09-07 MED ORDER — PHENOL 1.4 % MT LIQD
1.0000 | OROMUCOSAL | Status: DC | PRN
  Filled 2018-09-07: qty 177

## 2018-09-07 MED ORDER — IBUPROFEN 200 MG PO TABS: 600.0000 mg | ORAL_TABLET | Freq: Three times a day (TID) | ORAL | 0 refills | Status: AC

## 2018-09-07 MED ORDER — PENICILLIN V POTASSIUM 500 MG PO TABS: 500.0000 mg | ORAL_TABLET | Freq: Three times a day (TID) | ORAL | 0 refills | Status: AC

## 2018-09-07 NOTE — Discharge Instructions (Signed)
Penicillin V tablets What is this medicine? PENICILLIN V (pen i SILL in V) is a penicillin antibiotic. It is used to treat certain kinds of bacterial infections. It will not work for colds, flu, or other viral infections. This medicine may be used for other purposes; ask your health care provider or pharmacist if you have questions. COMMON BRAND NAME(S): Frankey Shown, Veetids What should I tell my health care provider before I take this medicine? They need to know if you have any of these conditions: -asthma -bowel disease, like colitis -eczema -kidney disease -an unusual or allergic reaction to penicillin, cephalosporins, other antibiotics or medicines, foods, tartrazine or other dyes, or preservatives -pregnant or trying to get pregnant -breast-feeding How should I use this medicine? Take this medicine by mouth with a full glass of water. Follow the directions on the prescription label. Take your medicine at regular intervals. Do not take your medicine more often than directed. Take all of your medicine as directed even if you think your are better. Do not skip doses or stop your medicine early. Talk to your pediatrician regarding the use of this medicine in children. While this drug may be prescribed for selected conditions, precautions do apply. Overdosage: If you think you have taken too much of this medicine contact a poison control center or emergency room at once. NOTE: This medicine is only for you. Do not share this medicine with others. What if I miss a dose? If you miss a dose, take it as soon as you can. If it is almost time for your next dose, take only that dose. Do not take double or extra doses. What may interact with this medicine? -birth control pills -methotrexate -other antibiotics -probenecid -some vaccines This list may not describe all possible interactions. Give your health care provider a list of all the medicines, herbs, non-prescription drugs, or dietary  supplements you use. Also tell them if you smoke, drink alcohol, or use illegal drugs. Some items may interact with your medicine. What should I watch for while using this medicine? Tell your doctor or health care professional if your symptoms do not improve. Do not treat diarrhea with over the counter products. Contact your doctor if you have diarrhea that lasts more than 2 days or if it is severe and watery. If you have diabetes, you may get a false-positive result for sugar in your urine. Check with your doctor or health care professional. Birth control pills may not work properly while you are taking this medicine. Talk to your doctor about using an extra method of birth control. What side effects may I notice from receiving this medicine? Side effects that you should report to your doctor or health care professional as soon as possible: -allergic reactions like skin rash or hives, swelling of the face, lips, or tongue -breathing problems -fever -new symptoms of infection -redness, blistering, peeling or loosening of the skin, including inside the mouth -unusually weak or tired Side effects that usually do not require medical attention (report to your doctor or health care professional if they continue or are bothersome): -diarrhea -headache -nausea, vomiting -sore mouth or tongue -stomach upset This list may not describe all possible side effects. Call your doctor for medical advice about side effects. You may report side effects to FDA at 1-800-FDA-1088. Where should I keep my medicine? Keep out of the reach of children. Store at room temperature between 15 and 30 degrees C (59 and 86 degrees F). Keep container tightly closed. Throw  away any unused medicine after the expiration date. NOTE: This sheet is a summary. It may not cover all possible information. If you have questions about this medicine, talk to your doctor, pharmacist, or health care provider.  2019 Elsevier/Gold Standard  (2007-12-29 12:59:13)  Aspirin, ASA  What is this medicine? ASPIRIN (AS pir in) is a pain reliever. It is used to treat mild pain and fever. This medicine is also used as directed by a doctor to prevent and to treat heart attacks, to prevent strokes and blood clots, and to treat arthritis or inflammation. This medicine may be used for other purposes; ask your health care provider or pharmacist if you have questions. COMMON BRAND NAME(S): Bayer Aspirin, Bayer Children's Aspirin, [facility] Aspirin What should I tell my health care provider before I take this medicine? They need to know if you have any of these conditions: -anemia -asthma -bleeding problems -child with chickenpox, the flu, or other viral infection -diabetes -gout -if you frequently drink alcohol containing drinks -kidney disease -liver disease -low level of vitamin K -lupus -smoke tobacco -stomach ulcers or other problems -an unusual or allergic reaction to aspirin, tartrazine dye, other medicines, dyes, or preservatives -pregnant or trying to get pregnant -breast-feeding How should I use this medicine? Take this medicine by mouth. Chew it completely before swallowing. Follow the directions on the package or prescription label. Do not take your medicine more often than directed. Talk to your pediatrician regarding the use of this medicine in children. While this drug may be prescribed for children as young as 6 years of age for selected conditions, precautions do apply. Children and teenagers should not use this medicine to treat chicken pox or flu symptoms unless directed by a doctor. Patients over 67 years old may have a stronger reaction and need a smaller dose. Overdosage: If you think you have taken too much of this medicine contact a poison control center or emergency room at once. NOTE: This medicine is only for you. Do not share this medicine with others. What if I miss a dose? If you are taking this medicine  on a regular schedule and miss a dose, take it as soon as you can. If it is almost time for your next dose, take only that dose. Do not take double or extra doses. What may interact with this medicine? Do not take this medicine with any of the following medications: -cidofovir -ketorolac -probenecid This medicine may also interact with the following medications: -alcohol -alendronate -bismuth subsalicylate -flavocoxid -herbal supplements like feverfew, garlic, ginger, ginkgo biloba, horse chestnut -medicines for diabetes or glaucoma like acetazolamide, methazolamide -medicines for gout -medicines that treat or prevent blood clots like enoxaparin, heparin, ticlopidine, warfarin -other aspirin and aspirin-like medicines -NSAIDs, medicines for pain and inflammation, like ibuprofen or naproxen -pemetrexed -sulfinpyrazone -varicella live vaccine This list may not describe all possible interactions. Give your health care provider a list of all the medicines, herbs, non-prescription drugs, or dietary supplements you use. Also tell them if you smoke, drink alcohol, or use illegal drugs. Some items may interact with your medicine. What should I watch for while using this medicine? If you are treating yourself for pain, tell your doctor or health care professional if the pain lasts more than 10 days, if it gets worse, or if there is a new or different kind of pain. Tell your doctor if you see redness or swelling. Also, check with your doctor if you have a fever that lasts for more  than 3 days. Only take this medicine to prevent heart attacks or blood clotting if prescribed by your doctor or health care professional. Do not take aspirin or aspirin-like medicines with this medicine. Too much aspirin can be dangerous. Always read the labels carefully. This medicine can irritate your stomach or cause bleeding problems. Do not smoke cigarettes or drink alcohol while taking this medicine. Do not lie down for  30 minutes after taking this medicine to prevent irritation to your throat. If you are scheduled for any medical or dental procedure, tell your healthcare provider that you are taking this medicine. You may need to stop taking this medicine before the procedure. What side effects may I notice from receiving this medicine? Side effects that you should report to your doctor or health care professional as soon as possible: -allergic reactions like skin rash, itching or hives, swelling of the face, lips, or tongue -breathing problems -changes in hearing, ringing in the ears -confusion -general ill feeling or flu-like symptoms -pain on swallowing -redness, blistering, peeling or loosening of the skin, including inside the mouth or nose -signs and symptoms of bleeding such as bloody or black, tarry stools; red or dark-brown urine; spitting up blood or brown material that looks like coffee grounds; red spots on the skin; unusual bruising or bleeding from the eye, gums, or nose -trouble passing urine or change in the amount of urine -unusually weak or tired -yellowing of the eyes or skin Side effects that usually do not require medical attention (report to your doctor or health care professional if they continue or are bothersome): -diarrhea or constipation -nausea, vomiting -stomach gas, heartburn This list may not describe all possible side effects. Call your doctor for medical advice about side effects. You may report side effects to FDA at 1-800-FDA-1088. Where should I keep my medicine? Keep out of the reach of children. Store at room temperature between 15 and 30 degrees C (59 and 86 degrees F). Protect from heat and moisture. Do not use this medicine if it has a strong vinegar smell. Throw away any unused medicine after the expiration date. NOTE: This sheet is a summary. It may not cover all possible information. If you have questions about this medicine, talk to your doctor, pharmacist, or  health care provider.  2019 Elsevier/Gold Standard (2016-07-14 10:58:47)  Ibuprofen tablets and capsules What is this medicine? IBUPROFEN (eye BYOO proe fen) is a non-steroidal anti-inflammatory drug (NSAID). It is used for dental pain, fever, headaches or migraines, osteoarthritis, rheumatoid arthritis, or painful monthly periods. It can also relieve minor aches and pains caused by a cold, flu, or sore throat. This medicine may be used for other purposes; ask your health care provider or pharmacist if you have questions. COMMON BRAND NAME(S): Advil, Advil Junior Strength, Advil Migraine, Genpril, Ibren, IBU, Midol, Midol Cramps and Body Aches, Motrin, Motrin IB, Motrin Junior Strength, Motrin Migraine Pain, Samson-8, Toxicology Saliva Collection What should I tell my health care provider before I take this medicine? They need to know if you have any of these conditions: -cigarette smoker -coronary artery bypass graft (CABG) surgery within the past 2 weeks -drink more than 3 alcohol-containing drinks a day -heart disease -high blood pressure -history of stomach bleeding -kidney disease -liver disease -lung or breathing disease, like asthma -an unusual or allergic reaction to ibuprofen, aspirin, other NSAIDs, other medicines, foods, dyes, or preservatives -pregnant or trying to get pregnant -breast-feeding How should I use this medicine? Take this medicine by mouth  with a glass of water. Follow the directions on the prescription label. Take this medicine with food if your stomach gets upset. Try to not lie down for at least 10 minutes after you take the medicine. Take your medicine at regular intervals. Do not take your medicine more often than directed. A special MedGuide will be given to you by the pharmacist with each prescription and refill. Be sure to read this information carefully each time. Talk to your pediatrician regarding the use of this medicine in children. Special care may be  needed. Overdosage: If you think you have taken too much of this medicine contact a poison control center or emergency room at once. NOTE: This medicine is only for you. Do not share this medicine with others. What if I miss a dose? If you miss a dose, take it as soon as you can. If it is almost time for your next dose, take only that dose. Do not take double or extra doses. What may interact with this medicine? Do not take this medicine with any of the following medications: -cidofovir -ketorolac -methotrexate -pemetrexed This medicine may also interact with the following medications: -alcohol -aspirin -diuretics -lithium -other drugs for inflammation like prednisone -warfarin This list may not describe all possible interactions. Give your health care provider a list of all the medicines, herbs, non-prescription drugs, or dietary supplements you use. Also tell them if you smoke, drink alcohol, or use illegal drugs. Some items may interact with your medicine. What should I watch for while using this medicine? Tell your doctor or healthcare professional if your symptoms do not start to get better or if they get worse. This medicine does not prevent heart attack or stroke. In fact, this medicine may increase the chance of a heart attack or stroke. The chance may increase with longer use of this medicine and in people who have heart disease. If you take aspirin to prevent heart attack or stroke, talk with your doctor or health care professional. Do not take other medicines that contain aspirin, ibuprofen, or naproxen with this medicine. Side effects such as stomach upset, nausea, or ulcers may be more likely to occur. Many medicines available without a prescription should not be taken with this medicine. This medicine can cause ulcers and bleeding in the stomach and intestines at any time during treatment. Ulcers and bleeding can happen without warning symptoms and can cause death. To reduce your  risk, do not smoke cigarettes or drink alcohol while you are taking this medicine. You may get drowsy or dizzy. Do not drive, use machinery, or do anything that needs mental alertness until you know how this medicine affects you. Do not stand or sit up quickly, especially if you are an older patient. This reduces the risk of dizzy or fainting spells. This medicine can cause you to bleed more easily. Try to avoid damage to your teeth and gums when you brush or floss your teeth. This medicine may be used to treat migraines. If you take migraine medicines for 10 or more days a month, your migraines may get worse. Keep a diary of headache days and medicine use. Contact your healthcare professional if your migraine attacks occur more frequently. What side effects may I notice from receiving this medicine? Side effects that you should report to your doctor or health care professional as soon as possible: -allergic reactions like skin rash, itching or hives, swelling of the face, lips, or tongue -severe stomach pain -signs and symptoms of  bleeding such as bloody or black, tarry stools; red or dark-brown urine; spitting up blood or brown material that looks like coffee grounds; red spots on the skin; unusual bruising or bleeding from the eye, gums, or nose -signs and symptoms of a blood clot such as changes in vision; chest pain; severe, sudden headache; trouble speaking; sudden numbness or weakness of the face, arm, or leg -unexplained weight gain or swelling -unusually weak or tired -yellowing of eyes or skin Side effects that usually do not require medical attention (report to your doctor or health care professional if they continue or are bothersome): -bruising -diarrhea -dizziness, drowsiness -headache -nausea, vomiting This list may not describe all possible side effects. Call your doctor for medical advice about side effects. You may report side effects to FDA at 1-800-FDA-1088. Where should I  keep my medicine? Keep out of the reach of children. Store at room temperature between 15 and 30 degrees C (59 and 86 degrees F). Keep container tightly closed. Throw away any unused medicine after the expiration date. NOTE: This sheet is a summary. It may not cover all possible information. If you have questions about this medicine, talk to your doctor, pharmacist, or health care provider.  2019 Elsevier/Gold Standard (2017-02-03 12:43:57)

## 2018-09-07 NOTE — Discharge Summary (Signed)
Physician Discharge Summary  Chad Reese XTK:[id number] DOB: [birth year] DOA: 09/06/2018  PCP: Chad Reese  Admit date: 09/06/2018 Discharge date: 09/07/2018  Admitted From: Home Disposition:  Home  Recommendations for Outpatient Follow-up:  1. Follow up with PCP Reese 1-2 weeks 2. Follow up with Cardiology as scheduled  Discharge Condition:Stable CODE STATUS:Full Diet recommendation: Regular   Brief/Interim Summary: 28 y.o. male with medical history significant for hypertension and Crohn's disease, now presenting to the emergency department for evaluation of sore throat.  Patient reports that he been Reese his usual state of health until approximately 4 days ago when he developed a sore throat with nonproductive cough and intermittent chest discomfort.  Symptoms have been slowly worsening.  He denies any fevers or chills.  Describes his chest discomfort as localized to the central chest, worse when going upstairs to his apartment, and better with rest.  He has not experienced this type of chest discomfort previously.  He believes that his father had a heart attack at age 41.  Reports that his Crohn's has been well controlled with Remicade  Discharge Diagnoses:  Principal Problem:   Elevated troponin Active Problems:   Pharyngitis due to group A beta hemolytic Streptococci   Hypertension   Crohn disease ([place])  1. Elevated troponin  - Presents with sore throat and intermittent chest discomfort, found to have streptococcal pharyngitis and troponin of 0.44  - EKG with ST abnormalities that do not appear significantly different than previous EKG  - Cardiology consulted, recommendation for cardiac MRI -Underwent cardiac MRI on 3/25 with unremarkable findings -Cardiology recommendation for asa 366m daily with 6012mTID motrin x 4 weeks -Pt to follow up with Cardiology as outpatient  2. Streptococcal pharyngitis  - Presents with sore throat, swab positive for GAS  - Treat with  penicillin v x 10 day course  3. Hypertension  - Continue beta-blocker   4. Crohn's disease  - Patient reports well-controlled on Remicade     Discharge Instructions   Allergies as of 09/07/2018      Reactions   Prednisone Nausea And Vomiting      Medication List    TAKE these medications   aspirin EC 325 MG tablet Take 1 tablet (325 mg total) by mouth daily.   atenolol 50 MG tablet Commonly known as:  TENORMIN Take 50 mg by mouth 2 (two) times daily.   ibuprofen 200 MG tablet Commonly known as:  Motrin IB Take 3 tablets (600 mg total) by mouth 3 (three) times daily for 28 days.   multivitamin tablet Take 1 tablet by mouth daily.   NYQUIL PO Take 30 mLs by mouth daily as needed (cold/flu).   penicillin v potassium 500 MG tablet Commonly known as:  VEETID Take 1 tablet (500 mg total) by mouth every 8 (eight) hours for 10 days.   REMICADE IV Inject into the vein every 8 (eight) weeks.      Follow-up Information    Follow up with your PCP Reese 1-2 weeks Follow up.        Follow up with Cardiology as scheduled Follow up.          Allergies  Allergen Reactions  . Prednisone Nausea And Vomiting    Consultations:  Cardiology  Procedures/Studies: Dg Chest Portable 1 View  Result Date: 09/06/2018 CLINICAL DATA:  [age]ear old male with cough and sore throat for 4 days. EXAM: PORTABLE CHEST 1 VIEW COMPARISON:  04/20/2018 and earlier. FINDINGS: Portable AP upright view at  2037 hours. Normal lung volumes and mediastinal contours. Visualized tracheal air column is within normal limits. Allowing for portable technique the lungs are clear. No pneumothorax or pleural effusion. Negative visible bowel gas pattern. No osseous abnormality identified. IMPRESSION: Negative.  No cardiopulmonary abnormality. Electronically Signed   By: Chad Reese M.D.   On: 09/06/2018 20:48   Mr Cardiac Morphology W Wo Contrast  Result Date: 09/07/2018 CLINICAL DATA:  Chest Pain Elevated  Troponin R/O Myocarditis EXAM: CARDIAC MRI TECHNIQUE: The patient was scanned on a 1.5 Tesla GE magnet. A dedicated cardiac coil was used. Functional imaging was done using Fiesta sequences. 2,3, and 4 chamber views were done to assess for RWMA's. Modified Simpson's rule using a short axis stack was used to calculate an ejection fraction on a dedicated work Conservation officer, nature. The patient received 10 cc of Multihance. After 10 minutes inversion recovery sequences were used to assess for infiltration and scar tissue. CONTRAST:  XQJJHERD FINDINGS: All 4 cardiac chambers were normal Reese size and function. There was no ASD/VSD. The aortic root was normal 3.2 cm. The RV was normal Reese size and function. The LV was normal Septal thickness 8 mm. No RWMAls The quantitative EF was 56% (EDV 108 cc ESV 48 cc SV 61 cc) The TV, MV and AV appeared normal. There was a trivial posterior pericardial effusion. Delayed enhancement images with gadavist showed no evidence of myocarditis IMPRESSION: 1.  Trivial posterior pericardial effusion 2.  Normal LV size and function with no RWMAls EF 56% 3.  No delayed myocardiac enhancement with gadolinium No myocarditis 4.  Normal TV, MV and AV 5.  Normal RV size and function 6.  Normal aortic root 3.2 cm 7.  Normal atria Chad Reese Electronically Signed   By: Chad Reese M.D.   On: 09/07/2018 12:10     Subjective: Eager to go home today  Discharge Exam: Vitals:   09/07/18 0229 09/07/18 0435  BP: 128/73 (!) 139/91  Pulse: 62 (!) 56  Resp:  14  Temp:  97.8 F (36.6 C)  SpO2:  98%   Vitals:   09/07/18 0032 09/07/18 0100 09/07/18 0229 09/07/18 0435  BP: (!) 155/102 (!) 139/108 128/73 (!) 139/91  Pulse: (!) 53 62 62 (!) 56  Resp: 13 16  14   Temp:  97.8 F (36.6 C)  97.8 F (36.6 C)  TempSrc:  Oral  Oral  SpO2: 99% 99%  98%  Weight:      Height:        General: Pt is alert, awake, not Reese acute distress Cardiovascular: RRR, S1/S2 +, no rubs, no  gallops Respiratory: CTA bilaterally, no wheezing, no rhonchi Abdominal: Soft, NT, ND, bowel sounds + Extremities: no edema, no cyanosis   The results of significant diagnostics from this hospitalization (including imaging, microbiology, ancillary and laboratory) are listed below for reference.     Microbiology: Recent Results (from the past 240 hour(s))  Group A Strep by PCR     Status: Abnormal   Collection Time: 09/06/18  9:01 PM  Result Value Ref Range Status   Group A Strep by PCR DETECTED (A) NOT DETECTED Final    Comment: Performed at [facility], [place] [address] [place], [place] [address]     Labs: BNP (last 3 results) No results for input(s): BNP Reese the last 8760 hours. Basic Metabolic Panel: Recent Labs  Lab 09/06/18 2101 09/07/18 0112  NA 136 137  K 3.9 4.1  CL 105  105  CO2 25 24  GLUCOSE 105* 96  BUN 13 12  CREATININE 0.91 0.88  CALCIUM 9.2 9.2   Liver Function Tests: No results for input(s): AST, ALT, ALKPHOS, BILITOT, PROT, ALBUMIN Reese the last 168 hours. No results for input(s): LIPASE, AMYLASE Reese the last 168 hours. No results for input(s): AMMONIA Reese the last 168 hours. CBC: Recent Labs  Lab 09/06/18 2101 09/07/18 0112  WBC 5.6 5.2  NEUTROABS 2.3 1.7  HGB 14.9 14.7  HCT 45.5 45.9  MCV 87.8 87.8  PLT 322 279   Cardiac Enzymes: Recent Labs  Lab 09/06/18 2101 09/07/18 0112 09/07/18 0742  TROPONINI 0.44* 0.45* 0.46*   BNP: Invalid input(s): POCBNP CBG: No results for input(s): GLUCAP Reese the last 168 hours. D-Dimer Recent Labs    09/06/18 2101  DDIMER 0.28   Hgb A1c No results for input(s): HGBA1C Reese the last 72 hours. Lipid Profile No results for input(s): CHOL, HDL, LDLCALC, TRIG, CHOLHDL, LDLDIRECT Reese the last 72 hours. Thyroid function studies No results for input(s): TSH, T4TOTAL, T3FREE, THYROIDAB Reese the last 72 hours.  Invalid input(s): FREET3 Anemia work up No results for input(s): VITAMINB12, FOLATE,  FERRITIN, TIBC, IRON, RETICCTPCT Reese the last 72 hours. Urinalysis    Component Value Date/Time   BILIRUBINUR negative 07/24/2016 1615   KETONESUR negative 07/24/2016 1615   PROTEINUR negative 07/24/2016 1615   UROBILINOGEN 0.2 07/24/2016 1615   NITRITE Negative 07/24/2016 1615   LEUKOCYTESUR Negative 07/24/2016 1615   Sepsis Labs Invalid input(s): PROCALCITONIN,  WBC,  LACTICIDVEN Microbiology Recent Results (from the past 240 hour(s))  Group A Strep by PCR     Status: Abnormal   Collection Time: 09/06/18  9:01 PM  Result Value Ref Range Status   Group A Strep by PCR DETECTED (A) NOT DETECTED Final    Comment: Performed at [facility], [place] [address], [place], [place] [address]   Time spent: 65mn  SIGNED:   SMarylu Lund MD  [facility] 09/07/2018, 1:04 PM  If 7PM-7AM, please contact night-coverage

## 2018-09-07 NOTE — Consult Note (Addendum)
Cardiology Consultation:   Patient ID: Chad Reese MRN: [id number]; DOB: [birth year]  Admit date: 09/06/2018 Date of Consult: 09/07/2018  Primary Care Provider: System, Provider Not In Primary Cardiologist: New  Primary Electrophysiologist:  None    Patient Profile:   Chad Reese is a 28 y.o. male with a hx of HTN and Crohn's disease who is being seen today for the evaluation of elevated troponin at the request of Dr. Myna Hidalgo.  History of Present Illness:   Mr. Chad Reese is a pleasant 28 year old male was past medical history of hypertension and Crohn's disease.  He does not have any prior cardiac history however does have significant family history of heart issues.  He says his father had a heart attack at age 84 and his uncle also received a stent before age 39 as well.  He never smoked himself and denies any illicit drug use.  Roughly 5 days ago, he started having throat pain.  About 3 to 4 days ago he started having chest discomfort or shortness of breath as well.  Due to concern of recent COVID-19 outbreak, he decided to seek medical attention at [facility].  Throat swab PCR was positive for group A strep.  He also noticed some chest discomfort with body rotation however denies any chest pain with deep inspiration or palpation.  Initial blood work showed a normal white blood cell count, normal d-dimer.  His troponin came back at 0.44 and a 2-hour later, troponin was flat at 0.45.  Cardiology has been consulted for elevated troponin.  Past Medical History:  Diagnosis Date  . Crohn disease ([place])   . Hypertension     History reviewed. No pertinent surgical history.   Home Medications:  Prior to Admission medications   Medication Sig Start Date End Date Taking? Authorizing Provider  atenolol (TENORMIN) 50 MG tablet Take 50 mg by mouth 2 (two) times daily.   Yes [provider]  InFLIXimab (REMICADE IV) Inject into the vein every 8 (eight) weeks.    Yes [provider]  Multiple Vitamin (MULTIVITAMIN) tablet Take 1 tablet by mouth daily.   Yes [provider]  Pseudoeph-Doxylamine-DM-APAP (NYQUIL PO) Take 30 mLs by mouth daily as needed (cold/flu).   Yes [provider]    Inpatient Medications: Scheduled Meds: . atenolol  50 mg Oral BID  . enoxaparin (LOVENOX) injection  40 mg Subcutaneous QHS  . penicillin v potassium  500 mg Oral Q8H  . sodium chloride flush  3 mL Intravenous Q12H   Continuous Infusions: . 0.45 % NaCl with KCl 20 mEq / L 90 mL/hr at 09/07/18 0600   PRN Meds: acetaminophen **OR** acetaminophen, HYDROcodone-acetaminophen, morphine injection, ondansetron **OR** ondansetron (ZOFRAN) IV  Allergies:    Allergies  Allergen Reactions  . Prednisone Nausea And Vomiting    Social History:   Social History   Socioeconomic History  . Marital status: Single    Spouse name: Not on file  . Number of children: Not on file  . Years of education: Not on file  . Highest education level: Not on file  Occupational History  . Not on file  Social Needs  . Financial resource strain: Not on file  . Food insecurity:    Worry: Not on file    Inability: Not on file  . Transportation needs:    Medical: Not on file    Non-medical: Not on file  Tobacco Use  . Smoking status: Never Smoker  . Smokeless tobacco:  Never Used  Substance and Sexual Activity  . Alcohol use: Yes    Comment: 1 q wk  . Drug use: No  . Sexual activity: Not on file  Lifestyle  . Physical activity:    Days per week: Not on file    Minutes per session: Not on file  . Stress: Not on file  Relationships  . Social connections:    Talks on phone: Not on file    Gets together: Not on file    Attends religious service: Not on file    Active member of club or organization: Not on file    Attends meetings of clubs or organizations: Not on file    Relationship status: Not on file  . Intimate partner violence:    Fear of current or ex  partner: Not on file    Emotionally abused: Not on file    Physically abused: Not on file    Forced sexual activity: Not on file  Other Topics Concern  . Not on file  Social History Narrative  . Not on file    Family History:    Family History  Problem Relation Age of Onset  . Heart failure Father      ROS:  Please see the history of present illness.   All other ROS reviewed and negative.     Physical Exam/Data:   Vitals:   09/07/18 0032 09/07/18 0100 09/07/18 0229 09/07/18 0435  BP: (!) 155/102 (!) 139/108 128/73 (!) 139/91  Pulse: (!) 53 62 62 (!) 56  Resp: 13 16  14   Temp:  97.8 F (36.6 C)  97.8 F (36.6 C)  TempSrc:  Oral  Oral  SpO2: 99% 99%  98%  Weight:      Height:        Intake/Output Summary (Last 24 hours) at 09/07/2018 0802 Last data filed at 09/07/2018 0600 Gross per 24 hour  Intake 387.7 ml  Output -  Net 387.7 ml   Last 3 Weights 09/06/2018 07/24/2016 03/15/2015  Weight (lbs) 205 lb 192 lb 184 lb  Weight (kg) 92.987 kg 87.091 kg 83.462 kg     Body mass index is 32.59 kg/m.  General:  Well nourished, well developed, in no acute distress HEENT: normal Lymph: no adenopathy Neck: no JVD Endocrine:  No thryomegaly Vascular: No carotid bruits; FA pulses 2+ bilaterally without bruits  Cardiac:  normal S1, S2; RRR; no murmur  Lungs:  clear to auscultation bilaterally, no wheezing, rhonchi or rales  Abd: soft, nontender, no hepatomegaly  Ext: no edema Musculoskeletal:  No deformities, BUE and BLE strength normal and equal Skin: warm and dry  Neuro:  CNs 2-12 intact, no focal abnormalities noted Psych:  Normal affect   EKG:  The EKG was personally reviewed and demonstrates:  NSR with early repol Telemetry:  Telemetry was personally reviewed and demonstrates:  NSR without significant ventricular ectopy  Relevant CV Studies:  N/A  Laboratory Data:  Chemistry Recent Labs  Lab 09/06/18 2101 09/07/18 0112  NA 136 137  K 3.9 4.1  CL 105 105   CO2 25 24  GLUCOSE 105* 96  BUN 13 12  CREATININE 0.91 0.88  CALCIUM 9.2 9.2  GFRNONAA >60 >60  GFRAA >60 >60  ANIONGAP 6 8    No results for input(s): PROT, ALBUMIN, AST, ALT, ALKPHOS, BILITOT in the last 168 hours. Hematology Recent Labs  Lab 09/06/18 2101 09/07/18 0112  WBC 5.6 5.2  RBC 5.18 5.23  HGB 14.9  14.7  HCT 45.5 45.9  MCV 87.8 87.8  MCH 28.8 28.1  MCHC 32.7 32.0  RDW 12.4 12.5  PLT 322 279   Cardiac Enzymes Recent Labs  Lab 09/06/18 2101 09/07/18 0112  TROPONINI 0.44* 0.45*   No results for input(s): TROPIPOC in the last 168 hours.  BNPNo results for input(s): BNP, PROBNP in the last 168 hours.  DDimer  Recent Labs  Lab 09/06/18 2101  DDIMER 0.28    Radiology/Studies:  Dg Chest Portable 1 View  Result Date: 09/06/2018 CLINICAL DATA:  28 year old male with cough and sore throat for 4 days. EXAM: PORTABLE CHEST 1 VIEW COMPARISON:  04/20/2018 and earlier. FINDINGS: Portable AP upright view at 2037 hours. Normal lung volumes and mediastinal contours. Visualized tracheal air column is within normal limits. Allowing for portable technique the lungs are clear. No pneumothorax or pleural effusion. Negative visible bowel gas pattern. No osseous abnormality identified. IMPRESSION: Negative.  No cardiopulmonary abnormality. Electronically Signed   By: Genevie Ann M.D.   On: 09/06/2018 20:48    Assessment and Plan:   1. Chest pain and elevated troponin: In the setting of strep A pharyngitis.  Underlying EKG showed early repol without reciprocal changes.  Serial troponin trend flat.  Pending echocardiogram. Per Dr. Johnsie Cancel, pending cardiac MRI to further assess   2. Strep a pharyngitis: On droplet precaution. Defer to primary team for management  3. Hypertension: Blood pressure stable  4. Crohn's disease: No recent flare.      For questions or updates, please contact [place] Please consult [email or web address] for contact info under    Hilbert Corrigan, [place]   09/07/2018 8:02 AM  Patient examined chart reviewed. Exam with black male in no distress no rub or cardiac murmur lungs clear Strep positive. Possible myopericarditis with flat torponin ECG early repolarization normal for young black male No signs of pericarditis on exam or ECG Have ordered cardiac MRI better test to look at pericardium and r/o Myocarditis. Would Rx with 325 mg ASA daily , motrin 600 mg tid and colchicine 0.60 mg bid for 4 weeks Rx Strep pharyngitis with penicillin On Atenol for HTN  [name]

## 2018-09-09 ENCOUNTER — Telehealth: Payer: Self-pay

## 2018-09-09 NOTE — Telephone Encounter (Signed)
Wendall Stade, MD  [facility] [address] Scheduling; Ethelda Chick, RN; Oliver Barre F        Needs post hospital telephone/video visit 4 weeks d/c with diagnosis of pericarditis    Left message for patient to call back.

## 2018-09-23 NOTE — Telephone Encounter (Signed)
Left message for patient to call back  

## 2018-09-27 NOTE — Telephone Encounter (Signed)
Left third message for patient to call back. 

## 2018-10-06 NOTE — Telephone Encounter (Signed)
Will mail patient a letter.

## 2018-11-11 ENCOUNTER — Emergency Department (HOSPITAL_COMMUNITY)
Admission: EM | Admit: 2018-11-11 | Discharge: 2018-11-11 | Disposition: A | Discharge status: Home / Self Care | Payer: BLUE CROSS/BLUE SHIELD | Attending: Emergency Medicine | Admitting: Emergency Medicine

## 2018-11-11 ENCOUNTER — Encounter (HOSPITAL_COMMUNITY): Payer: Self-pay

## 2018-11-11 ENCOUNTER — Other Ambulatory Visit: Payer: Self-pay

## 2018-11-11 DIAGNOSIS — M25572 Pain in left ankle and joints of left foot: Secondary | ICD-10-CM

## 2018-11-11 DIAGNOSIS — Z79899 Other long term (current) drug therapy: Secondary | ICD-10-CM | POA: Insufficient documentation

## 2018-11-11 DIAGNOSIS — I10 Essential (primary) hypertension: Secondary | ICD-10-CM | POA: Insufficient documentation

## 2018-11-11 DIAGNOSIS — R1084 Generalized abdominal pain: Secondary | ICD-10-CM | POA: Insufficient documentation

## 2018-11-11 MED ORDER — PREDNISONE 10 MG PO TABS: ORAL_TABLET | ORAL | 0 refills | Status: AC

## 2018-11-11 MED ORDER — ATENOLOL 50 MG PO TABS
50.0000 mg | ORAL_TABLET | Freq: Once | ORAL | Status: AC
  Administered 2018-11-11: 50 mg via ORAL
  Filled 2018-11-11: qty 1

## 2018-11-11 MED ORDER — PREDNISONE 20 MG PO TABS
60.0000 mg | ORAL_TABLET | Freq: Once | ORAL | Status: AC
  Administered 2018-11-11: 60 mg via ORAL
  Filled 2018-11-11: qty 3

## 2018-11-11 MED ORDER — ATENOLOL 50 MG PO TABS: 50.0000 mg | ORAL_TABLET | Freq: Two times a day (BID) | ORAL | 0 refills | Status: AC

## 2018-11-11 NOTE — Discharge Instructions (Signed)
We recommend that you discontinue budesonide and switch to prednisone.  You may wish to consult your doctor prior to changes in medication.  Take your other daily medications as prescribed.  We recommend icing and elevation of your ankle to help alleviate pain and swelling.  You may return to the ED for new or concerning symptoms.

## 2018-11-11 NOTE — ED Provider Notes (Signed)
[facility]-EMERGENCY DEPT Provider Note   CSN: [id number] Arrival date & time: 11/11/18  0444    History   Chief Complaint Chief Complaint  Patient presents with  . Abdominal Pain  . Ankle Pain    HPI Chad Reese is a 28 y.o. male.     28 year old male with a history of Crohn's disease and hypertension presents to the emergency department for atraumatic left ankle pain.  He states that his left ankle joint has been hurting over the past few days.  He has migratory joint pains frequently when he is not able to get his Remicade infusions.  He last had an infusion in February, but has been unable to receive additional infusions due to loss of insurance coverage.  He was started on budesonide by his doctor at [facility] ~3 weeks ago with little symptomatic improvement.  Patient continues to be compliant with his Imuran.  He has been taking ibuprofen for additional pain control without relief.  Patient further noting intermittent generalized cramping abdominal pain. He has been having 3-4 bowel movements per day c/w diarrhea. No melena, hematochezia, fevers, vomiting. No hx of abdominal surgeries.  The history is provided by the patient. No language interpreter was used.  Abdominal Pain  Ankle Pain    Past Medical History:  Diagnosis Date  . Crohn disease (HCC)   . Hypertension     Patient Active Problem List   Diagnosis Date Noted  . Elevated troponin 09/06/2018  . 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 Hypertension 09/06/2018  . Crohn disease (HCC) 09/06/2018    History reviewed. No pertinent surgical history.      Home Medications    Prior to Admission medications   Medication Sig Start Date End Date Taking? Authorizing Provider  acetaminophen (TYLENOL) 500 MG tablet Take 1,000 mg by mouth every 6 (six) hours as needed for mild pain.   Yes [provider]  azaTHIOprine (IMURAN) 50 MG tablet Take 50 mg by mouth  3 (three) times daily. 10/12/18  Yes [provider]  budesonide (ENTOCORT EC) 3 MG 24 hr capsule Take 3 mg by mouth 3 (three) times daily.   Yes [provider]  InFLIXimab (REMICADE IV) Inject into the vein every 8 (eight) weeks.    Yes [provider]  aspirin EC 325 MG tablet Take 1 tablet (325 mg total) by mouth daily. Patient not taking: Reported on 11/11/2018 09/07/18   Jerald Kief, MD  atenolol (TENORMIN) 50 MG tablet Take 1 tablet (50 mg total) by mouth 2 (two) times daily. 11/11/18   Antony Madura, PA-C  predniSONE (DELTASONE) 10 MG tablet Take 4 tablets (40 mg total) by mouth daily with breakfast for 7 days, THEN 3 tablets (30 mg total) daily with breakfast for 7 days, THEN 2 tablets (20 mg total) daily with breakfast for 7 days, THEN 1 tablet (10 mg total) daily with breakfast for 7 days. 11/11/18 12/09/18  Antony Madura, PA-C    Family History Family History  Problem Relation Age of Onset  . Heart failure Father     Social History Social History   Tobacco Use  . Smoking status: Never Smoker  . Smokeless tobacco: Never Used  Substance Use Topics  . Alcohol use: Yes    Comment: 1 q wk  . Drug use: No     Allergies   Prednisone   Review of Systems Review of Systems  Gastrointestinal: Positive for abdominal pain.  Ten systems reviewed and are negative for acute change, except as noted in the HPI.    Physical Exam Updated Vital Signs BP (!) 143/95   Pulse 88   Temp 99.3 F (37.4 C) (Oral)   Resp 17   Ht 5\' 6"  (1.676 m)   Wt 93 kg   SpO2 97%   BMI 33.09 kg/m   Physical Exam Vitals signs and nursing note reviewed.  Constitutional:      General: He is not in acute distress.    Appearance: He is well-developed. He is not diaphoretic.     Comments: Nontoxic appearing and in NAD  HENT:     Head: Normocephalic and atraumatic.  Eyes:     General: No scleral icterus.    Conjunctiva/sclera: Conjunctivae normal.  Neck:      Musculoskeletal: Normal range of motion.  Cardiovascular:     Rate and Rhythm: Normal rate and regular rhythm.     Pulses: Normal pulses.  Pulmonary:     Effort: Pulmonary effort is normal. No respiratory distress.  Abdominal:     General: There is no distension.     Palpations: Abdomen is soft. There is no mass.     Tenderness: There is no guarding.     Comments: Soft, nondistended, generally nontender abdomen. No peritoneal signs.  Musculoskeletal: Normal range of motion.     Left ankle: He exhibits swelling. He exhibits normal range of motion, no deformity, no laceration and normal pulse. Tenderness. Lateral malleolus tenderness found. Achilles tendon normal.     Comments: Normal ROM of the L ankle with TTP to the lateral malleolus; minimal associated soft tissue swelling. No effusion, erythema, heat to touch.  Skin:    General: Skin is warm and dry.     Coloration: Skin is not pale.     Findings: No erythema or rash.  Neurological:     General: No focal deficit present.     Mental Status: He is alert and oriented to person, place, and time.     Coordination: Coordination normal.     Comments: Ambulatory with steady gait.  Psychiatric:        Behavior: Behavior normal.      ED Treatments / Results  Labs (all labs ordered are listed, but only abnormal results are displayed) Labs Reviewed - No data to display  EKG None  Radiology No results found.  Procedures Procedures (including critical care time)  Medications Ordered in ED Medications  atenolol (TENORMIN) tablet 50 mg (50 mg Oral Given 11/11/18 0553)  predniSONE (DELTASONE) tablet 60 mg (60 mg Oral Given 11/11/18 0554)     Initial Impression / Assessment and Plan / ED Course  I have reviewed the triage vital signs and the nursing notes.  Pertinent labs & imaging results that were available during my care of the patient were reviewed by me and considered in my medical decision making (see chart for details).         28 year old male presenting to the ED for complaints of joint pain which he attributes to lack of his Remicade infusions.  Pain primarily in his L ankle today.  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  He has been using budesonide and Imuran with little improvement.  Patient neurovascularly intact on exam. No erythema, heat to touch to the affected area; no concern for septic joint. Compartments in the affected extremity are soft. Plan for supportive management and primary care  follow up as needed. Declined crutches for WBAT.  Patient also complaining of intermittent abdominal cramping which is generalized and associated with increased stooling.  He has not had any melena or hematochezia.  No fevers, vomiting.  Presently with nontender abdominal exam.  Vital signs have been reassuring.  Hypertension secondary to medication noncompliance.  As the patient has reported no improvement since starting budesonide 3 weeks ago, will substitute for a prednisone taper.  He has been encouraged to follow-up with his primary care doctor and GI physician at [facility].  Return precautions discussed and provided. Patient discharged in stable condition with no unaddressed concerns.   Final Clinical Impressions(s) / ED Diagnoses   Final diagnoses:  Acute left ankle pain  Generalized abdominal pain    ED Discharge Orders         Ordered    atenolol (TENORMIN) 50 MG tablet  2 times daily     11/11/18 0555    predniSONE (DELTASONE) 10 MG tablet     11/11/18 0555           Antony Madura, PA-C 11/11/18 4268    Nira Conn, MD 11/15/18 [phone]

## 2018-11-11 NOTE — ED Notes (Signed)
Pt given and verbalized understanding of d/c instructions and need for follow up with pcp and gastroenterology. Told to return if s/s worsen. No further distress or questions upon ambulation out of department

## 2018-11-11 NOTE — ED Triage Notes (Signed)
Pt complains of abdominal pain for three weeks, states that its a crohn's flare and his ankle joint is hurting Pt is unable to get his meds

## 2019-05-15 IMAGING — DX PORTABLE CHEST - 1 VIEW
1 series · 1 of 1 positions shown · non-contrast
Comparison: 04/20/2018 and earlier.

CLINICAL DATA: 28-year-old male with cough and sore throat for 4
days.

EXAM:
PORTABLE CHEST 1 VIEW

[chest ap]
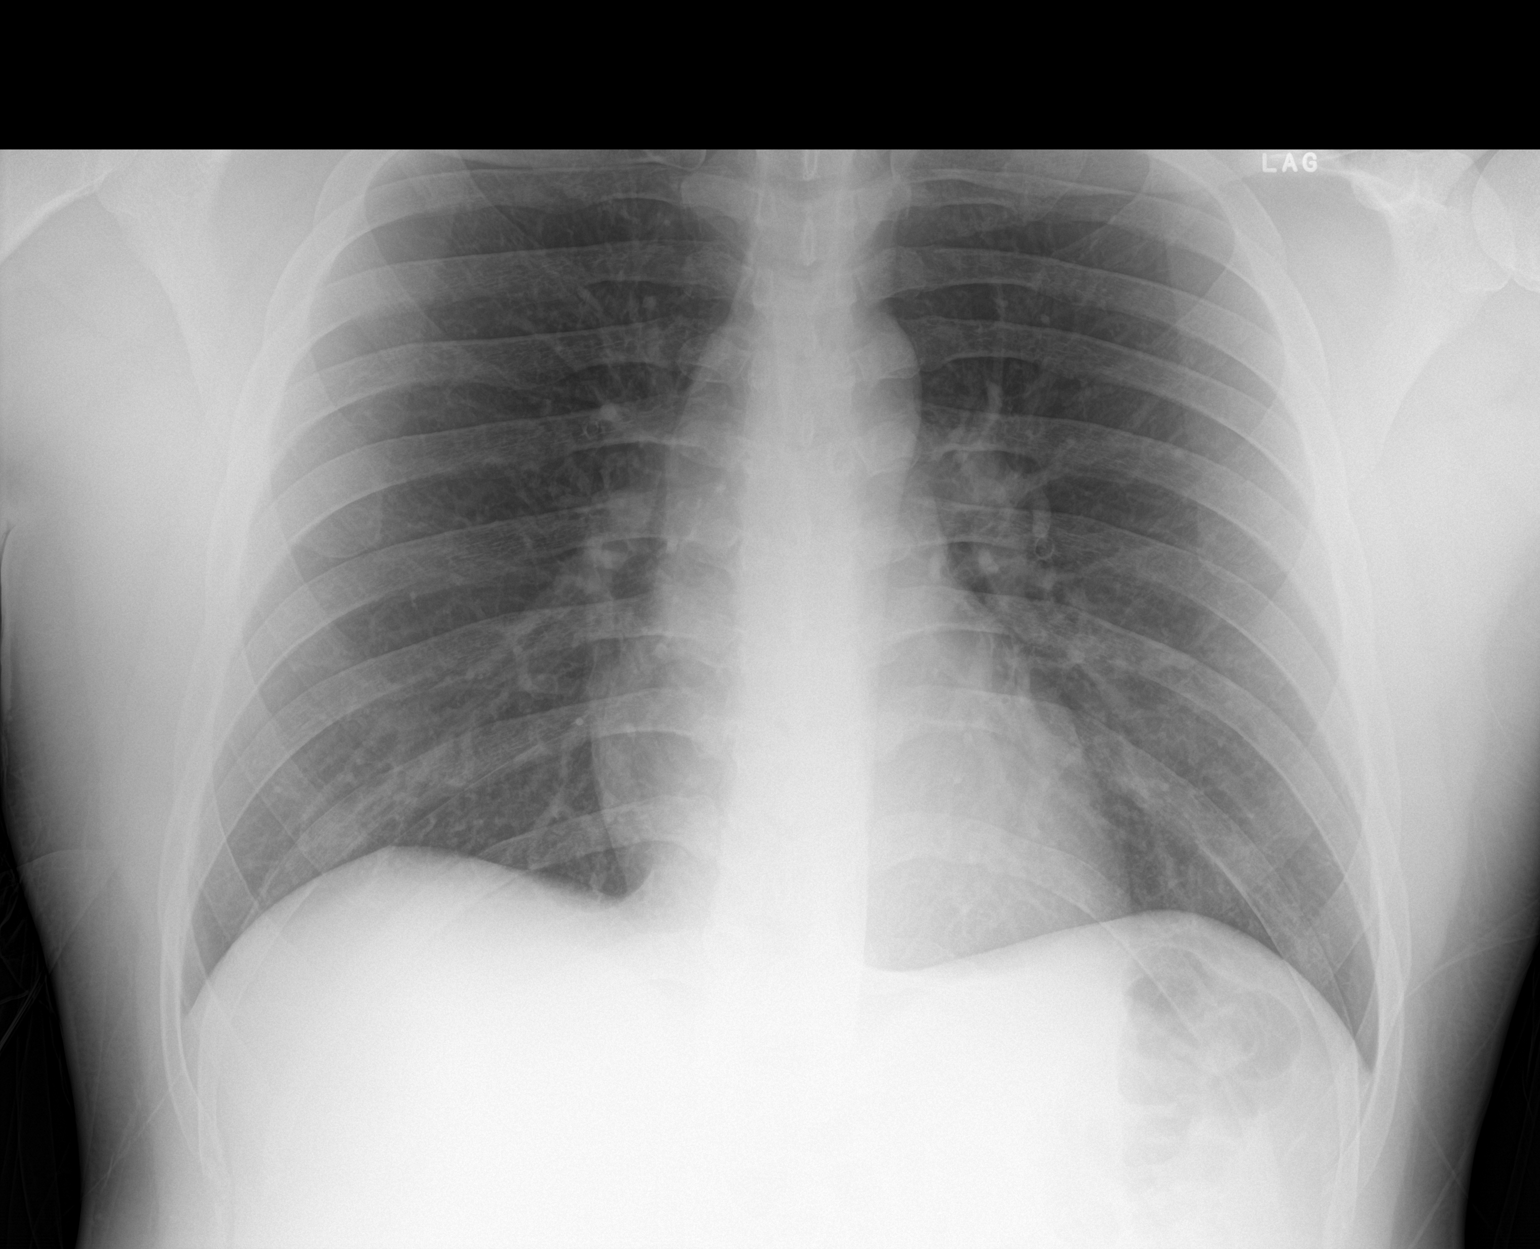

[1 of 1 positions shown; findings below may reference images not displayed]

FINDINGS: Portable AP upright view at 8028 hours. Normal lung volumes and
mediastinal contours. Visualized tracheal air column is within
normal limits. Allowing for portable technique the lungs are clear.
No pneumothorax or pleural effusion. Negative visible bowel gas
pattern. No osseous abnormality identified.
IMPRESSION: Negative.  No cardiopulmonary abnormality.
